# Patient Record
Sex: Female | Born: 1995
Health system: Southern US, Community
[De-identification: ages and names within clinical notes are randomized; demographics above are authoritative.]

## PROBLEM LIST (undated history)

## (undated) DIAGNOSIS — R51 Headache: Secondary | ICD-10-CM

## (undated) DIAGNOSIS — E7800 Pure hypercholesterolemia, unspecified: Secondary | ICD-10-CM

## (undated) DIAGNOSIS — R7303 Prediabetes: Secondary | ICD-10-CM

## (undated) DIAGNOSIS — K915 Postcholecystectomy syndrome: Secondary | ICD-10-CM

## (undated) DIAGNOSIS — E782 Mixed hyperlipidemia: Secondary | ICD-10-CM

## (undated) DIAGNOSIS — R519 Headache, unspecified: Secondary | ICD-10-CM

## (undated) DIAGNOSIS — E66812 Obesity, class 2: Secondary | ICD-10-CM

## (undated) DIAGNOSIS — G4710 Hypersomnia, unspecified: Secondary | ICD-10-CM

## (undated) DIAGNOSIS — F3281 Premenstrual dysphoric disorder: Secondary | ICD-10-CM

## (undated) DIAGNOSIS — G43909 Migraine, unspecified, not intractable, without status migrainosus: Secondary | ICD-10-CM

## (undated) DIAGNOSIS — M109 Gout, unspecified: Secondary | ICD-10-CM

## (undated) DIAGNOSIS — E039 Hypothyroidism, unspecified: Secondary | ICD-10-CM

## (undated) DIAGNOSIS — F411 Generalized anxiety disorder: Secondary | ICD-10-CM

## (undated) DIAGNOSIS — K219 Gastro-esophageal reflux disease without esophagitis: Secondary | ICD-10-CM

## (undated) DIAGNOSIS — E669 Obesity, unspecified: Secondary | ICD-10-CM

## (undated) HISTORY — DX: Postcholecystectomy syndrome: K91.5

## (undated) HISTORY — DX: Mixed hyperlipidemia: E78.2

## (undated) HISTORY — DX: Premenstrual dysphoric disorder: F32.81

## (undated) HISTORY — DX: Migraine, unspecified, not intractable, without status migrainosus: G43.909

## (undated) HISTORY — DX: Headache, unspecified: R51.9

## (undated) HISTORY — DX: Obesity, unspecified: E66.9

## (undated) HISTORY — DX: Gastro-esophageal reflux disease without esophagitis: K21.9

## (undated) HISTORY — DX: Prediabetes: R73.03

## (undated) HISTORY — DX: Generalized anxiety disorder: F41.1

## (undated) HISTORY — DX: Hypothyroidism, unspecified: E03.9

## (undated) HISTORY — DX: Obesity, class 2: E66.812

## (undated) HISTORY — DX: Gout, unspecified: M10.9

## (undated) HISTORY — PX: TONSILLECTOMY: SUR1361

## (undated) HISTORY — DX: Hypersomnia, unspecified: G47.10

## (undated) HISTORY — DX: Headache: R51

---
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
## 2015-12-01 ENCOUNTER — Ambulatory Visit (INDEPENDENT_AMBULATORY_CARE_PROVIDER_SITE_OTHER): Payer: 59 | Admitting: Pediatrics

## 2015-12-01 ENCOUNTER — Encounter: Payer: Self-pay | Admitting: Pediatrics

## 2015-12-01 VITALS — BP 132/86 | HR 79 | Temp 97.8°F | Ht 64.0 in | Wt 177.0 lb

## 2015-12-01 DIAGNOSIS — W57XXXA Bitten or stung by nonvenomous insect and other nonvenomous arthropods, initial encounter: Secondary | ICD-10-CM

## 2015-12-01 DIAGNOSIS — S70922A Unspecified superficial injury of left thigh, initial encounter: Secondary | ICD-10-CM | POA: Diagnosis not present

## 2015-12-01 NOTE — Progress Notes (Signed)
  Subjective:   Patient ID: Brittany Cooper, female    DOB: December 10, 1995, 20 y.o.   MRN: [id number] CC: tick bite left leg  HPI: Brittany Cooper is a 20 y.o. female presenting for tick bite left leg  Ticks behind her house, says was covered with ticks 2 weeks ago Showered right away Didn't notice any attached Noticed bug bite several cm across two days ago White in the center Was very itchy No tick seen Has had headaches for the past two weeks Recently cut all caffeine out from diet Has had caffeine withdrawal headaches in past Drank several reg sodas a day before abruptly stopping hasnt taken anything for the headaches No fevers No other rash No joint aches or pains Normal appetite Otherwise feels fine  Relevant past medical, surgical, family and social history reviewed. Allergies and medications reviewed and updated. History  Smoking Status  . Never Smoker  Smokeless Tobacco  . Never Used   ROS: Per HPI   Objective:    BP 132/86   Pulse 79   Temp 97.8 F (36.6 C) (Oral)   Ht 5\' 4"  (1.626 m)   Wt 177 lb (80.3 kg)   LMP 11/16/2015 (Approximate)   BMI 30.38 kg/m   Wt Readings from Last 3 Encounters:  12/01/15 177 lb (80.3 kg)    Gen: NAD, alert, cooperative with exam, NCAT EYES: EOMI, no conjunctival injection, or no icterus CV: NRRR, normal S1/S2, no murmur Resp: CTABL, no wheezes, normal WOB Ext: No edema, warm Neuro: Alert and oriented, strength equal b/l UE and LE, coordination grossly normal MSK: normal muscle bulk Skin: 3mm slightly raised, slightly red papule lateral L upper thigh. No surrounding redness  Assessment & Plan:  Brittany Cooper was seen today for tick bite left leg.  Diagnoses and all orders for this visit:  Bug bite OTC hydrocortisone, benadryl if needed Let me know if starts having fevers, body aches  Headache Likely due to caffeine withdrawal Let me know if not improving OK to try NSAIDs a couple times a week for HA  Follow up  plan: No Follow-up on file. Rex Krasarol Tuere Nwosu, MD [facility]

## 2016-01-07 ENCOUNTER — Telehealth: Payer: Self-pay | Admitting: Pediatrics

## 2016-01-08 NOTE — Telephone Encounter (Signed)
Shot record ready for pickup 

## 2016-04-04 ENCOUNTER — Telehealth: Payer: 59 | Admitting: Family

## 2016-04-04 DIAGNOSIS — R6889 Other general symptoms and signs: Secondary | ICD-10-CM

## 2016-04-04 MED ORDER — OSELTAMIVIR PHOSPHATE 75 MG PO CAPS: 75.0000 mg | ORAL_CAPSULE | Freq: Two times a day (BID) | ORAL | 0 refills | Status: DC

## 2016-04-04 NOTE — Progress Notes (Signed)

## 2016-11-01 DIAGNOSIS — M109 Gout, unspecified: Secondary | ICD-10-CM

## 2016-11-01 DIAGNOSIS — F3281 Premenstrual dysphoric disorder: Secondary | ICD-10-CM

## 2016-11-01 HISTORY — DX: Premenstrual dysphoric disorder: F32.81

## 2016-11-01 HISTORY — DX: Gout, unspecified: M10.9

## 2016-11-23 ENCOUNTER — Encounter: Payer: Self-pay | Admitting: Family Medicine

## 2016-11-23 ENCOUNTER — Ambulatory Visit (INDEPENDENT_AMBULATORY_CARE_PROVIDER_SITE_OTHER): Payer: BLUE CROSS/BLUE SHIELD | Admitting: Family Medicine

## 2016-11-23 VITALS — BP 120/76 | HR 70 | Temp 97.9°F | Resp 16 | Ht 63.0 in | Wt 158.0 lb

## 2016-11-23 DIAGNOSIS — M109 Gout, unspecified: Secondary | ICD-10-CM

## 2016-11-23 DIAGNOSIS — N920 Excessive and frequent menstruation with regular cycle: Secondary | ICD-10-CM | POA: Diagnosis not present

## 2016-11-23 DIAGNOSIS — M659 Synovitis and tenosynovitis, unspecified: Secondary | ICD-10-CM | POA: Diagnosis not present

## 2016-11-23 DIAGNOSIS — F3281 Premenstrual dysphoric disorder: Secondary | ICD-10-CM

## 2016-11-23 LAB — CBC
HEMATOCRIT: 44.4 % (ref 36.0–46.0)
HEMOGLOBIN: 14.8 g/dL (ref 12.0–15.0)
MCHC: 33.2 g/dL (ref 30.0–36.0)
MCV: 93.6 fl (ref 78.0–100.0)
PLATELETS: 286 10*3/uL (ref 150.0–400.0)
RBC: 4.75 Mil/uL (ref 3.87–5.11)
RDW: 13.3 % (ref 11.5–15.5)
WBC: 5.2 10*3/uL (ref 4.0–10.5)

## 2016-11-23 LAB — POCT URINE PREGNANCY: Preg Test, Ur: NEGATIVE

## 2016-11-23 MED ORDER — DROSPIRENONE-ETHINYL ESTRADIOL 3-0.02 MG PO TABS: ORAL_TABLET | ORAL | 4 refills | Status: DC

## 2016-11-23 MED ORDER — PREDNISONE 20 MG PO TABS: ORAL_TABLET | ORAL | 0 refills | Status: DC

## 2016-11-23 NOTE — Progress Notes (Signed)
Office Note 11/23/2016  CC:  Chief Complaint  Patient presents with  . Establish Care  . Foot Pain  . Menstrual Problem    PMDD?   HPI:  Brittany Cooper is a 21 y.o. female who is here to establish care Patient's most recent primary MD: [facility]--" a long time ago".  She does not have a GYN. Old records were not reviewed prior to or during today's visit.  Past 2.5 weeks has felt a pain from L MTP region extending up anteromedial foot, only hurts with dorsal flexion and extreme of plantar flexion of foot.  Tennis shoes just touching it hurts it.  No trauma or overuse preceding this pain.  Noted not erythema or swelling in the area.  She notes she has been eating a lot more red meat lately with her keto diet. No hx of similar pain, no hx of gout.  Tried ibup--no help.  Her menses are occurring regularly.  Lasts 5d, beginning 1-2 d heavy, then lightens up next 3d. No intermenstrual bleeding. About 1 week before her menses begin, she gets very emotional/angry/irritable--"so bad I can't stand it". This has been the way her menses/psych state has been going for at least 6 mo now. Says it ONLY happens before her menses.   She is not on ocp but would like to be.  She is sexually active. LMP--currently on menses.    FH: mom had PCOS.  Past Medical History:  Diagnosis Date  . Frequent headaches   . Migraines     Past Surgical History:  Procedure Laterality Date  . TONSILLECTOMY     child    Family History  Problem Relation Age of Onset  . Hyperlipidemia Father   . Heart disease Father   . Hypertension Father   . Diabetes Father   . Heart attack Father 75  . Arthritis Maternal Grandmother   . Breast cancer Maternal Grandmother   . Hyperlipidemia Maternal Grandmother   . Heart disease Maternal Grandmother   . Hypertension Maternal Grandmother   . Diabetes Maternal Grandmother   . Multiple myeloma Maternal Grandfather   . Arthritis Paternal Grandmother   . Liver disease  Paternal Grandfather   . Alcohol abuse Paternal Grandfather     Social History   Social History  . Marital status: Single    Spouse name: N/A  . Number of children: N/A  . Years of education: N/A   Occupational History  . Not on file.   Social History Main Topics  . Smoking status: Never Smoker  . Smokeless tobacco: Never Used  . Alcohol use No  . Drug use: No  . Sexual activity: No   Other Topics Concern  . Not on file   Social History Narrative   Single, no children.   Educ: BA at [employer]---wants to go to [employer] as of 11/2016.   Occup: Therapist, occupational at [employer].   No tob.   Rare alc.   No drugs.    Outpatient Encounter Prescriptions as of 11/23/2016  Medication Sig  . drospirenone-ethinyl estradiol (YAZ,GIANVI,LORYNA) 3-0.02 MG tablet 1 tab po qd, start next pack after only a 4 day pill-free interval  . predniSONE (DELTASONE) 20 MG tablet 2 tabs po qd x 5d  . [DISCONTINUED] oseltamivir (TAMIFLU) 75 MG capsule Take 1 capsule (75 mg total) by mouth 2 (two) times daily. (Patient not taking: Reported on 11/23/2016)  . [DISCONTINUED] terbinafine (LAMISIL) 250 MG tablet Take 250 mg by mouth  daily.   No facility-administered encounter medications on file as of 11/23/2016.     No Known Allergies  ROS Review of Systems  Constitutional: Negative for fatigue and fever.  HENT: Negative for congestion and sore throat.   Eyes: Negative for visual disturbance.  Respiratory: Negative for cough.   Cardiovascular: Negative for chest pain.  Gastrointestinal: Negative for abdominal pain and nausea.  Genitourinary: Negative for dysuria.  Musculoskeletal: Positive for arthralgias (see hpi). Negative for back pain and joint swelling.  Skin: Negative for rash.  Neurological: Negative for weakness and headaches.  Hematological: Negative for adenopathy.    PE; Blood pressure 120/76, pulse 70, temperature 97.9 F (36.6 C), temperature source Oral, resp. rate  16, height _0  (1.6 m), weight 158 lb (71.7 kg), last menstrual period 11/20/2016, SpO2 99 %. Body mass index is 27.99 kg/m.  Gen: Alert, well appearing.  Patient is oriented to person, place, time, and situation. AFFECT: pleasant, lucid thought and speech. IDU:PBDH: no injection, icteris, swelling, or exudate.  EOMI, PERRLA. Mouth: lips without lesion/swelling.  Oral mucosa pink and moist. Oropharynx without erythema, exudate, or swelling.  CV: RRR, no m/r/g.   LUNGS: CTA bilat, nonlabored resps, good aeration in all lung fields. EXT: no clubbing, cyanosis, or edema.  Left foot: no erythema, swelling, or deformity.  She has TTP most significantly over L MTP joint and just proximal to this joint--dorsal, lateral, and plantar surface.  Dorsal surface also with signif TTP over extensor tendon all the way up medial aspect of foot, ankle (medial to lateral malleolus), up to mid tibial level medial to tibia.  No erythema or warmth of the areas of tenderness.  ROM of ankles and toes intact but L great toe stiff.  Pain in L ankle with resisted ankle flexion and extension. Remaining musculoskeletal: no joint swelling, erythema, warmth, or tenderness.  ROM of all joints intact. Neuro: CN 2-12 intact bilaterally, strength 5/5 in proximal and distal upper extremities and lower extremities bilaterally.    No tremor.  No ataxia.     Pertinent labs:   POC UPT today was NEG  ASSESSMENT AND PLAN:   New pt; no old records to obtain.  1) Gouty arthritis of L MTP joint, with accompanying extensor tendon tenosynovitis. Prednisone 40 mg qd x 5d. Relative rest at L ankle joint (minimize flexion/extension). Stop her current high protein diet. If recurrent problem, then will check uric acid level, consider gout prophylaxis med.  2) Premenstrual dysphoric disorder: will start Yaz.  She wants the [facility] for contraception purposes as well. She'll start this the day her current menses finishes. She will do a 4 day  pill-free interval instead of the usual 7 day pill-free interval. Check CBC today given history of heavy cycles.  An After Visit Summary was printed and given to the patient.  Return in about 3 months (around 02/23/2017) for f/u PMDD.  Signed:  Crissie Sickles, MD           11/23/2016

## 2016-11-24 ENCOUNTER — Encounter: Payer: Self-pay | Admitting: *Deleted

## 2017-02-20 ENCOUNTER — Other Ambulatory Visit: Payer: Self-pay

## 2017-02-20 ENCOUNTER — Ambulatory Visit (INDEPENDENT_AMBULATORY_CARE_PROVIDER_SITE_OTHER): Payer: BLUE CROSS/BLUE SHIELD | Admitting: Family Medicine

## 2017-02-20 ENCOUNTER — Encounter: Payer: Self-pay | Admitting: Family Medicine

## 2017-02-20 VITALS — BP 131/79 | HR 81 | Temp 98.1°F | Resp 16 | Ht 63.0 in | Wt 160.5 lb

## 2017-02-20 DIAGNOSIS — F411 Generalized anxiety disorder: Secondary | ICD-10-CM | POA: Diagnosis not present

## 2017-02-20 DIAGNOSIS — F3281 Premenstrual dysphoric disorder: Secondary | ICD-10-CM

## 2017-02-20 MED ORDER — DROSPIRENONE-ETHINYL ESTRADIOL 3-0.02 MG PO TABS: ORAL_TABLET | ORAL | 4 refills | Status: DC

## 2017-02-20 MED ORDER — CITALOPRAM HYDROBROMIDE 20 MG PO TABS: 20.0000 mg | ORAL_TABLET | Freq: Every day | ORAL | 1 refills | Status: DC

## 2017-02-20 NOTE — Progress Notes (Signed)
OFFICE VISIT  02/20/2017   CC:  Chief Complaint  Patient presents with  . Follow-up    PMDD    HPI:    Patient is a 21 y.o.  female who presents for 3 mo f/u premenstrual dysphoric disorder. Last visit I started her on Yaz with 4 day interval between cycling pills instead of 7. Also treated her for a likely episode of gout at that time with prednisone.  She did not feel any improvement since getting on the Yaz. She now thinks she has more of chronic anxiety/irritability problem, not just something that affects her in premenstrual period. She admits to chronic worry, irritability, "overthinking everything", perseverates on thins that bother her. Denies depressed mood. Changing diet and increasing exercise has not helped. This is all interfering with her job, boyfriend, family relationships. Has all been present "my whole life" but worse the last couple of years.  ROS: no halluc or delus.  Her menses are regular and NOT with heavy bleeding since getting on Yaz.  Past Medical History:  Diagnosis Date  . Frequent headaches   . Gout 11/2016  . Migraines   . PMDD (premenstrual dysphoric disorder) 11/2016    Past Surgical History:  Procedure Laterality Date  . TONSILLECTOMY     child    Outpatient Medications Prior to Visit  Medication Sig Dispense Refill  . drospirenone-ethinyl estradiol (YAZ,GIANVI,LORYNA) 3-0.02 MG tablet 1 tab po qd, start next pack after only a 4 day pill-free interval 3 Package 4  . predniSONE (DELTASONE) 20 MG tablet 2 tabs po qd x 5d (Patient not taking: Reported on 02/20/2017) 10 tablet 0   No facility-administered medications prior to visit.     No Known Allergies  ROS As per HPI  PE: Blood pressure 131/79, pulse 81, temperature 98.1 F (36.7 C), temperature source Oral, resp. rate 16, height 5\' 3"  (1.6 m), weight 160 lb 8 oz (72.8 kg), last menstrual period 02/15/2017, SpO2 99 %. Wt Readings from Last 2 Encounters:  02/20/17 160 lb 8 oz  (72.8 kg)  11/23/16 158 lb (71.7 kg)    Gen: alert, oriented x 4, affect pleasant.  Lucid thinking and conversation noted. HEENT: PERRLA, EOMI.   Neck: no LAD, mass, or thyromegaly. CV: RRR, no m/r/g LUNGS: CTA bilat, nonlabored. NEURO: no tremor or tics noted on observation.  Coordination intact. CN 2-12 grossly intact bilaterally, strength 5/5 in all extremeties.  No ataxia.  LABS:  Lab Results  Component Value Date   WBC 5.2 11/23/2016   HGB 14.8 11/23/2016   HCT 44.4 11/23/2016   MCV 93.6 11/23/2016   PLT 286.0 11/23/2016    IMPRESSION AND PLAN:  1) PMDD: minimally improved, likely b/c this is more of a GAD problem than we initially thought. She does have improvement of her menorrhagia on this med.  Continue Yaz.  2) GAD:  Start citalopram 20mg  qd. Therapeutic expectations and side effect profile of medication discussed today.  Patient's questions answered.  An After Visit Summary was printed and given to the patient.  FOLLOW UP: Return in about 4 weeks (around 03/20/2017) for f/u anxiety.  Signed:  Santiago BumpersPhil Kaleeyah Cuffie, MD           02/20/2017

## 2017-03-19 ENCOUNTER — Ambulatory Visit (INDEPENDENT_AMBULATORY_CARE_PROVIDER_SITE_OTHER): Payer: BLUE CROSS/BLUE SHIELD | Admitting: Family Medicine

## 2017-03-19 ENCOUNTER — Other Ambulatory Visit: Payer: Self-pay

## 2017-03-19 ENCOUNTER — Encounter: Payer: Self-pay | Admitting: Family Medicine

## 2017-03-19 VITALS — BP 119/79 | HR 65 | Temp 98.3°F | Resp 16 | Ht 63.0 in | Wt 159.5 lb

## 2017-03-19 DIAGNOSIS — F411 Generalized anxiety disorder: Secondary | ICD-10-CM | POA: Diagnosis not present

## 2017-03-19 MED ORDER — CITALOPRAM HYDROBROMIDE 20 MG PO TABS: 20.0000 mg | ORAL_TABLET | Freq: Every day | ORAL | 1 refills | Status: DC

## 2017-03-19 NOTE — Progress Notes (Signed)
OFFICE VISIT  03/19/2017   CC:  Chief Complaint  Patient presents with  . Follow-up    Anxeity   HPI:    Patient is a 21 y.o. Caucasian female who presents for 1 mo f/u GAD. Started citalopram 20mg  qd last o/v.  Doing very well.  "I can tell a huge difference".  Less worry, less irritability.  Side effect: yawns a lot. More energetic, sleeping much better.  Family, friends, coworkers can tell a big difference. No depression, no manic/hypomanic sx's.   Past Medical History:  Diagnosis Date  . Frequent headaches   . GAD (generalized anxiety disorder)   . Gout 11/2016  . Migraines   . PMDD (premenstrual dysphoric disorder) 11/2016    Past Surgical History:  Procedure Laterality Date  . TONSILLECTOMY     child    Outpatient Medications Prior to Visit  Medication Sig Dispense Refill  . drospirenone-ethinyl estradiol (YAZ,GIANVI,LORYNA) 3-0.02 MG tablet 1 tab po qd, start next pack after only a 4 day pill-free interval 3 Package 4  . citalopram (CELEXA) 20 MG tablet Take 1 tablet (20 mg total) daily by mouth. 30 tablet 1   No facility-administered medications prior to visit.     No Known Allergies  ROS As per HPI  PE: Blood pressure 119/79, pulse 65, temperature 98.3 F (36.8 C), temperature source Oral, resp. rate 16, height 5\' 3"  (1.6 m), weight 159 lb 8 oz (72.3 kg), last menstrual period 03/16/2017, SpO2 99 %. Wt Readings from Last 2 Encounters:  03/19/17 159 lb 8 oz (72.3 kg)  02/20/17 160 lb 8 oz (72.8 kg)    Gen: alert, oriented x 4, affect pleasant.  Lucid thinking and conversation noted. HEENT: PERRLA, EOMI.   Neck: no LAD, mass, or thyromegaly. CV: RRR, no m/r/g LUNGS: CTA bilat, nonlabored. NEURO: no tremor or tics noted on observation.  Coordination intact. CN 2-12 grossly intact bilaterally, strength 5/5 in all extremeties.  No ataxia.   LABS:  none  IMPRESSION AND PLAN:  GAD: GREAT response to citalopram 20mg  x 1 mo. In  remission. Discussed plan of remaining on this med for 1 yr and if she wants to try weening off at that time then we'll do that. RF's x 6 mo sent in today.  An After Visit Summary was printed and given to the patient.  FOLLOW UP: Return in about 6 months (around 09/17/2017) for annual CPE (fasting).  Signed:  Santiago BumpersPhil Thera Basden, MD           03/19/2017

## 2017-04-03 DIAGNOSIS — G4710 Hypersomnia, unspecified: Secondary | ICD-10-CM

## 2017-04-03 HISTORY — DX: Hypersomnia, unspecified: G47.10

## 2017-08-31 ENCOUNTER — Other Ambulatory Visit: Payer: Self-pay

## 2017-08-31 MED ORDER — CITALOPRAM HYDROBROMIDE 20 MG PO TABS: 20.0000 mg | ORAL_TABLET | Freq: Every day | ORAL | 0 refills | Status: DC

## 2017-09-19 ENCOUNTER — Encounter: Payer: BLUE CROSS/BLUE SHIELD | Admitting: Family Medicine

## 2017-11-11 ENCOUNTER — Other Ambulatory Visit: Payer: Self-pay | Admitting: Family Medicine

## 2017-11-12 MED ORDER — CITALOPRAM HYDROBROMIDE 20 MG PO TABS: 20.0000 mg | ORAL_TABLET | Freq: Every day | ORAL | 0 refills | Status: DC

## 2017-11-22 ENCOUNTER — Encounter: Payer: BLUE CROSS/BLUE SHIELD | Admitting: Family Medicine

## 2017-11-28 ENCOUNTER — Encounter: Payer: Self-pay | Admitting: Family Medicine

## 2017-11-28 ENCOUNTER — Telehealth: Payer: Self-pay | Admitting: *Deleted

## 2017-11-28 ENCOUNTER — Ambulatory Visit (INDEPENDENT_AMBULATORY_CARE_PROVIDER_SITE_OTHER): Payer: BLUE CROSS/BLUE SHIELD | Admitting: Family Medicine

## 2017-11-28 VITALS — BP 116/73 | HR 71 | Temp 98.2°F | Resp 16 | Ht 62.5 in | Wt 171.4 lb

## 2017-11-28 DIAGNOSIS — Z131 Encounter for screening for diabetes mellitus: Secondary | ICD-10-CM | POA: Diagnosis not present

## 2017-11-28 DIAGNOSIS — Z0000 Encounter for general adult medical examination without abnormal findings: Secondary | ICD-10-CM | POA: Diagnosis not present

## 2017-11-28 DIAGNOSIS — Z23 Encounter for immunization: Secondary | ICD-10-CM

## 2017-11-28 DIAGNOSIS — Z13220 Encounter for screening for lipoid disorders: Secondary | ICD-10-CM | POA: Diagnosis not present

## 2017-11-28 DIAGNOSIS — E669 Obesity, unspecified: Secondary | ICD-10-CM | POA: Diagnosis not present

## 2017-11-28 DIAGNOSIS — Z124 Encounter for screening for malignant neoplasm of cervix: Secondary | ICD-10-CM

## 2017-11-28 LAB — LIPID PANEL
CHOLESTEROL: 278 mg/dL — AB (ref 0–200)
HDL: 86.3 mg/dL (ref >39.00)
LDL Cholesterol: 174 mg/dL — ABNORMAL HIGH (ref 0–99)
NonHDL: 191.52
Total CHOL/HDL Ratio: 3
Triglycerides: 86 mg/dL (ref 0.0–149.0)
VLDL: 17.2 mg/dL (ref 0.0–40.0)

## 2017-11-28 LAB — BASIC METABOLIC PANEL
BUN: 16 mg/dL (ref 6–23)
CO2: 24 mEq/L (ref 19–32)
Calcium: 9.6 mg/dL (ref 8.4–10.5)
Chloride: 103 mEq/L (ref 96–112)
Creatinine, Ser: 0.86 mg/dL (ref 0.40–1.20)
GFR: 86.35 mL/min (ref >60.00)
Glucose, Bld: 83 mg/dL (ref 70–99)
POTASSIUM: 4 meq/L (ref 3.5–5.1)
Sodium: 137 mEq/L (ref 135–145)

## 2017-11-28 NOTE — Patient Instructions (Signed)

## 2017-11-28 NOTE — Telephone Encounter (Signed)
OK, referral ordered 

## 2017-11-28 NOTE — Telephone Encounter (Signed)
Referral pending, needs Dx.

## 2017-11-28 NOTE — Addendum Note (Signed)
Addended by: Jeoffrey MassedMCGOWEN, Channah Godeaux H on: 11/28/2017 04:46 PM   Modules accepted: Orders

## 2017-11-28 NOTE — Telephone Encounter (Signed)
Copied from CRM [id number]. Topic: Referral - Request >> Nov 28, 2017  3:28 PM Oneal GroutSebastian, Jennifer S wrote: Reason for CRM: Patient was seen today by Dr Milinda CaveMcGowen was told to call back with GYN providers name for referral, [facility]

## 2017-11-28 NOTE — Progress Notes (Addendum)
Office Note 11/28/2017  CC:  Chief Complaint  Patient presents with  . Annual Exam    Pt is fasting.     HPI:  Brittany Cooper is a 22 y.o. female who is here for annual health maintenance exam.  She had continued to have very heavy menses flow and started taking her OCP packs back to back w/out sugar pill use and now has menses about q2 mo and it is much lighter.  Working harder on diet lately after she noted she gained some wt, exercise is minimal.  No acute complaints.   Past Medical History:  Diagnosis Date  . Frequent headaches   . GAD (generalized anxiety disorder)   . Gout 11/2016  . Migraines   . PMDD (premenstrual dysphoric disorder) 11/2016    Past Surgical History:  Procedure Laterality Date  . TONSILLECTOMY     child    Family History  Problem Relation Age of Onset  . Hyperlipidemia Father   . Heart disease Father   . Hypertension Father   . Diabetes Father   . Heart attack Father 58  . Arthritis Maternal Grandmother   . Breast cancer Maternal Grandmother   . Hyperlipidemia Maternal Grandmother   . Heart disease Maternal Grandmother   . Hypertension Maternal Grandmother   . Diabetes Maternal Grandmother   . Multiple myeloma Maternal Grandfather   . Arthritis Paternal Grandmother   . Liver disease Paternal Grandfather   . Alcohol abuse Paternal Grandfather     Social History   Socioeconomic History  . Marital status: Single    Spouse name: Not on file  . Number of children: Not on file  . Years of education: Not on file  . Highest education level: Not on file  Occupational History  . Not on file  Social Needs  . Financial resource strain: Not on file  . Food insecurity:    Worry: Not on file    Inability: Not on file  . Transportation needs:    Medical: Not on file    Non-medical: Not on file  Tobacco Use  . Smoking status: Never Smoker  . Smokeless tobacco: Never Used  Substance and Sexual Activity  . Alcohol use: No  .  Drug use: No  . Sexual activity: Never  Lifestyle  . Physical activity:    Days per week: Not on file    Minutes per session: Not on file  . Stress: Not on file  Relationships  . Social connections:    Talks on phone: Not on file    Gets together: Not on file    Attends religious service: Not on file    Active member of club or organization: Not on file    Attends meetings of clubs or organizations: Not on file    Relationship status: Not on file  . Intimate partner violence:    Fear of current or ex partner: Not on file    Emotionally abused: Not on file    Physically abused: Not on file    Forced sexual activity: Not on file  Other Topics Concern  . Not on file  Social History Narrative   Single, no children.   Educ: BA at [employer]---wants to go to [employer] as of 11/2017.   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   No tob.   Rare alc.   No drugs.    Outpatient Medications Prior to Visit  Medication Sig Dispense  Refill  . citalopram (CELEXA) 20 MG tablet Take 1 tablet (20 mg total) by mouth daily. 30 tablet 0  . drospirenone-ethinyl estradiol (YAZ,GIANVI,LORYNA) 3-0.02 MG tablet 1 tab po qd, start next pack after only a 4 day pill-free interval 3 Package 4   No facility-administered medications prior to visit.     No Known Allergies  ROS Review of Systems  Constitutional: Negative for appetite change, chills, fatigue and fever.  HENT: Negative for congestion, dental problem, ear pain and sore throat.   Eyes: Negative for discharge, redness and visual disturbance.  Respiratory: Negative for cough, chest tightness, shortness of breath and wheezing.   Cardiovascular: Negative for chest pain, palpitations and leg swelling.  Gastrointestinal: Negative for abdominal pain, blood in stool, diarrhea, nausea and vomiting.  Genitourinary: Negative for difficulty urinating, dysuria, flank pain, frequency, hematuria and urgency.   Musculoskeletal: Negative for arthralgias, back pain, joint swelling, myalgias and neck stiffness.  Skin: Negative for pallor and rash.  Neurological: Negative for dizziness, speech difficulty, weakness and headaches.  Hematological: Negative for adenopathy. Does not bruise/bleed easily.  Psychiatric/Behavioral: Negative for confusion and sleep disturbance. The patient is not nervous/anxious.     PE; Blood pressure 116/73, pulse 71, temperature 98.2 F (36.8 C), temperature source Oral, resp. rate 16, height 5' 2.5" (1.588 m), weight 171 lb 6 oz (77.7 kg), last menstrual period 11/19/2017, SpO2 98 %. Body mass index is 30.85 kg/m.  Gen: Alert, well appearing.  Patient is oriented to person, place, time, and situation. AFFECT: pleasant, lucid thought and speech. ENT: Ears: EACs clear, normal epithelium.  TMs with good light reflex and landmarks bilaterally.  Eyes: no injection, icteris, swelling, or exudate.  EOMI, PERRLA. Nose: no drainage or turbinate edema/swelling.  No injection or focal lesion.  Mouth: lips without lesion/swelling.  Oral mucosa pink and moist.  Dentition intact and without obvious caries or gingival swelling.  Oropharynx without erythema, exudate, or swelling.  Neck: supple/nontender.  No LAD, mass, or TM.  Carotid pulses 2+ bilaterally, without bruits. CV: RRR, no m/r/g.   LUNGS: CTA bilat, nonlabored resps, good aeration in all lung fields. ABD: soft, NT, ND, BS normal.  No hepatospenomegaly or mass.  No bruits. EXT: no clubbing, cyanosis, or edema.  Musculoskeletal: no joint swelling, erythema, warmth, or tenderness.  ROM of all joints intact. Skin - no sores or suspicious lesions or rashes or color changes   Pertinent labs:  No results found for: TSH Lab Results  Component Value Date   WBC 5.2 11/23/2016   HGB 14.8 11/23/2016   HCT 44.4 11/23/2016   MCV 93.6 11/23/2016   PLT 286.0 11/23/2016    ASSESSMENT AND PLAN:   Health maintenance exam: Reviewed  age and gender appropriate health maintenance issues (prudent diet, regular exercise, health risks of tobacco and excessive alcohol, use of seatbelts, fire alarms in home, use of sunscreen).  Also reviewed age and gender appropriate health screening as well as vaccine recommendations. Vaccines: Tdap--> given today.   Flu--> given today. Labs: BMET (DM screening, obesity) and FLP (hyperchol screening-obesity). Cervical ca screening: refer to GYN MD--> Dr. Stann Mainland, [facility].  An After Visit Summary was printed and given to the patient.  FOLLOW UP:  Return in about 1 year (around 11/29/2018) for annual CPE (fasting).  Signed:  Crissie Sickles, MD           11/28/2017

## 2017-11-28 NOTE — Addendum Note (Signed)
Addended by: Smitty KnudsenSUTHERLAND, Ashlen Kiger K on: 11/28/2017 09:51 AM   Modules accepted: Orders

## 2017-11-29 ENCOUNTER — Encounter: Payer: Self-pay | Admitting: Family Medicine

## 2017-12-05 NOTE — Telephone Encounter (Signed)
Received fax from [facility] stating pt has apt with them on 12/17/17. They are requesting medical records. Fax given to Diane.

## 2017-12-10 ENCOUNTER — Other Ambulatory Visit: Payer: Self-pay | Admitting: Family Medicine

## 2017-12-10 ENCOUNTER — Encounter: Payer: Self-pay | Admitting: Family Medicine

## 2017-12-10 MED ORDER — CITALOPRAM HYDROBROMIDE 20 MG PO TABS: 20.0000 mg | ORAL_TABLET | Freq: Every day | ORAL | 3 refills | Status: DC

## 2017-12-11 ENCOUNTER — Encounter: Payer: Self-pay | Admitting: Family Medicine

## 2017-12-11 NOTE — Telephone Encounter (Signed)
Please advise. Thanks.  

## 2017-12-11 NOTE — Telephone Encounter (Signed)
Pt needs to be seen in office for this situation.-thx

## 2017-12-17 DIAGNOSIS — Z01419 Encounter for gynecological examination (general) (routine) without abnormal findings: Secondary | ICD-10-CM | POA: Diagnosis not present

## 2017-12-17 DIAGNOSIS — Z124 Encounter for screening for malignant neoplasm of cervix: Secondary | ICD-10-CM | POA: Diagnosis not present

## 2017-12-17 DIAGNOSIS — Z6831 Body mass index (BMI) 31.0-31.9, adult: Secondary | ICD-10-CM | POA: Diagnosis not present

## 2017-12-20 LAB — HM PAP SMEAR: HM Pap smear: NEGATIVE

## 2017-12-24 ENCOUNTER — Encounter: Payer: Self-pay | Admitting: Family Medicine

## 2018-01-30 ENCOUNTER — Encounter: Payer: Self-pay | Admitting: Family Medicine

## 2018-01-30 ENCOUNTER — Ambulatory Visit (INDEPENDENT_AMBULATORY_CARE_PROVIDER_SITE_OTHER): Payer: BLUE CROSS/BLUE SHIELD | Admitting: Family Medicine

## 2018-01-30 VITALS — BP 116/73 | HR 73 | Temp 98.4°F | Resp 16 | Ht 62.5 in | Wt 184.5 lb

## 2018-01-30 DIAGNOSIS — L659 Nonscarring hair loss, unspecified: Secondary | ICD-10-CM

## 2018-01-30 DIAGNOSIS — G4719 Other hypersomnia: Secondary | ICD-10-CM | POA: Diagnosis not present

## 2018-01-30 DIAGNOSIS — G4733 Obstructive sleep apnea (adult) (pediatric): Secondary | ICD-10-CM

## 2018-01-30 DIAGNOSIS — R351 Nocturia: Secondary | ICD-10-CM

## 2018-01-30 DIAGNOSIS — R635 Abnormal weight gain: Secondary | ICD-10-CM | POA: Diagnosis not present

## 2018-01-30 DIAGNOSIS — R358 Other polyuria: Secondary | ICD-10-CM | POA: Diagnosis not present

## 2018-01-30 DIAGNOSIS — R946 Abnormal results of thyroid function studies: Secondary | ICD-10-CM | POA: Diagnosis not present

## 2018-01-30 DIAGNOSIS — F411 Generalized anxiety disorder: Secondary | ICD-10-CM

## 2018-01-30 DIAGNOSIS — R3589 Other polyuria: Secondary | ICD-10-CM

## 2018-01-30 LAB — CBC WITH DIFFERENTIAL/PLATELET
Basophils Absolute: 0.1 10*3/uL (ref 0.0–0.1)
Basophils Relative: 0.8 % (ref 0.0–3.0)
Eosinophils Absolute: 0.1 10*3/uL (ref 0.0–0.7)
Eosinophils Relative: 2 % (ref 0.0–5.0)
HCT: 41.1 % (ref 36.0–46.0)
Hemoglobin: 14.1 g/dL (ref 12.0–15.0)
LYMPHS ABS: 2.1 10*3/uL (ref 0.7–4.0)
Lymphocytes Relative: 29.8 % (ref 12.0–46.0)
MCHC: 34.3 g/dL (ref 30.0–36.0)
MCV: 89.2 fl (ref 78.0–100.0)
MONOS PCT: 6.6 % (ref 3.0–12.0)
Monocytes Absolute: 0.5 10*3/uL (ref 0.1–1.0)
NEUTROS ABS: 4.4 10*3/uL (ref 1.4–7.7)
NEUTROS PCT: 60.8 % (ref 43.0–77.0)
Platelets: 277 10*3/uL (ref 150.0–400.0)
RBC: 4.61 Mil/uL (ref 3.87–5.11)
RDW: 12.9 % (ref 11.5–15.5)
WBC: 7.2 10*3/uL (ref 4.0–10.5)

## 2018-01-30 LAB — COMPREHENSIVE METABOLIC PANEL
ALT: 13 U/L (ref 0–35)
AST: 15 U/L (ref 0–37)
Albumin: 4.1 g/dL (ref 3.5–5.2)
Alkaline Phosphatase: 41 U/L (ref 39–117)
BILIRUBIN TOTAL: 0.5 mg/dL (ref 0.2–1.2)
BUN: 11 mg/dL (ref 6–23)
CHLORIDE: 103 meq/L (ref 96–112)
CO2: 24 mEq/L (ref 19–32)
CREATININE: 0.78 mg/dL (ref 0.40–1.20)
Calcium: 9.4 mg/dL (ref 8.4–10.5)
GFR: 96.5 mL/min (ref >60.00)
Glucose, Bld: 80 mg/dL (ref 70–99)
Potassium: 4 mEq/L (ref 3.5–5.1)
Sodium: 137 mEq/L (ref 135–145)
Total Protein: 7.6 g/dL (ref 6.0–8.3)

## 2018-01-30 LAB — POCT URINALYSIS DIPSTICK
BILIRUBIN UA: NEGATIVE
Glucose, UA: NEGATIVE
KETONES UA: NEGATIVE
Leukocytes, UA: NEGATIVE
Nitrite, UA: NEGATIVE
PH UA: 6.5 (ref 5.0–8.0)
Protein, UA: NEGATIVE
RBC UA: NEGATIVE
Spec Grav, UA: 1.02 (ref 1.010–1.025)
UROBILINOGEN UA: 0.2 U/dL

## 2018-01-30 LAB — T4, FREE: Free T4: 0.59 ng/dL — ABNORMAL LOW (ref 0.60–1.60)

## 2018-01-30 LAB — IRON: Iron: 107 ug/dL (ref 42–145)

## 2018-01-30 LAB — TSH: TSH: 7.22 u[IU]/mL — ABNORMAL HIGH (ref 0.35–4.50)

## 2018-01-30 NOTE — Progress Notes (Signed)
OFFICE VISIT  01/30/2018   CC:  Chief Complaint  Patient presents with  . Thyroid Problem    ? Fatigue, dizziness, nausea, hair loss  . Weight Gain    due to medication?   HPI:    Patient is a 22 y.o. Caucasian female who presents for discussion of GAD and her medication: citalopram, which she has been on for 1 yr now.  She has gained 24 lbs since being on citalopram. Appetite is up, wants to eat all the time. Hair is coming out "in clumps" in shower.  Feels sleepy tired all day long.  +Snores.  No known witnessed apnea. She has noted the excessive daytime sleepiness more as she has gained more wt. No constipation.    Has chronic dry skin, no worse lately. Mood: no depression. She is "moody" and irritable and anxious some days and takes an extra 1/2 citalopram. These "bad days" are not nearly as common a good days.  No panic attacks.   +Polyuria and +nocturia, worsening last couple months.  Thirsty: but quenched but sweet tea and sugary drinks. +Nausea recently, w/out vomiting.  Taking dramamine.  No pain.  No fevers, no joint swelling. Her menses: no bleeding since august.  Takes OCP. Urine with stronger smell lately.  No dysuria.  She c/o excessive fatigue, specifically excessive daytime sleepiness.  She snores but has never been told she has any abnormal breathing during sleep. Sleep questionnaire today: score indicated high risk for OSA.  Past Medical History:  Diagnosis Date  . Frequent headaches   . GAD (generalized anxiety disorder)   . Gout 11/2016  . Migraines   . PMDD (premenstrual dysphoric disorder) 11/2016    Past Surgical History:  Procedure Laterality Date  . TONSILLECTOMY     child    Outpatient Medications Prior to Visit  Medication Sig Dispense Refill  . citalopram (CELEXA) 20 MG tablet Take 1 tablet (20 mg total) by mouth daily. 30 tablet 3  . NIKKI 3-0.02 MG tablet TAKE 1 TABLET BY MOUTH EVERY DAY START NEXT PACK AFTER ONLY A 4 DAY PILL-FREE  INTERVAL 84 tablet 1   No facility-administered medications prior to visit.     No Known Allergies  ROS As per HPI  PE: Blood pressure 116/73, pulse 73, temperature 98.4 F (36.9 C), temperature source Oral, resp. rate 16, height 5' 2.5" (1.588 m), weight 184 lb 8 oz (83.7 kg), SpO2 99 %. Gen: Alert, well appearing.  Patient is oriented to person, place, time, and situation. AFFECT: pleasant, lucid thought and speech. Neck: no thyromegaly or thyroid nodule or tenderness. CV: RRR, no m/r/g.   LUNGS: CTA bilat, nonlabored resps, good aeration in all lung fields. EXT: no clubbing or cyanosis.  no edema.  SKIN: no rash.  LABS:    Chemistry      Component Value Date/Time   NA 137 11/28/2017 0942   K 4.0 11/28/2017 0942   CL 103 11/28/2017 0942   CO2 24 11/28/2017 0942   BUN 16 11/28/2017 0942   CREATININE 0.86 11/28/2017 0942      Component Value Date/Time   CALCIUM 9.6 11/28/2017 0942     Lab Results  Component Value Date   WBC 5.2 11/23/2016   HGB 14.8 11/23/2016   HCT 44.4 11/23/2016   MCV 93.6 11/23/2016   PLT 286.0 11/23/2016   CC UA today: completely normal.  IMPRESSION AND PLAN:  1) Excessive wt gain/increased appetite: possibly side effect of her citalopram (although wt gain  on this particular SSRI is unusual). If all lab w/u today is normal, will ween off this med and start trial of fluoxetine (also an SSRI not usually associated with wt gain) Check TSH, T4, T3, CMET, CBC, iron panel, UA (looking for glucosuria).  2) Hair loss, abnl wt gain: will r/o hypothyroidism and iron deficiency.  3) Excessive daytime sleepiness: high risk for OSA. Will order referral to neurologist/sleep MD to further evaluate.  4) GAD: stable.  See #1 above.  An After Visit Summary was printed and given to the patient.  FOLLOW UP: 4 wks  Signed:  Santiago Bumpers, MD           01/30/2018

## 2018-01-31 LAB — IRON AND TIBC
IRON SATURATION: 24 % (ref 15–55)
Iron: 105 ug/dL (ref 27–159)
TIBC: 429 ug/dL (ref 250–450)
UIBC: 324 ug/dL (ref 131–425)

## 2018-02-01 ENCOUNTER — Encounter: Payer: Self-pay | Admitting: Family Medicine

## 2018-02-01 DIAGNOSIS — E038 Other specified hypothyroidism: Secondary | ICD-10-CM

## 2018-02-01 HISTORY — DX: Other specified hypothyroidism: E03.8

## 2018-02-04 LAB — THYROID PEROXIDASE ANTIBODY: Thyroperoxidase Ab SerPl-aCnc: 263 IU/mL — ABNORMAL HIGH (ref <9)

## 2018-02-04 LAB — T3: T3, Total: 182 ng/dL — ABNORMAL HIGH (ref 76–181)

## 2018-02-04 LAB — TEST AUTHORIZATION

## 2018-02-05 ENCOUNTER — Encounter: Payer: Self-pay | Admitting: Neurology

## 2018-02-05 ENCOUNTER — Other Ambulatory Visit: Payer: Self-pay | Admitting: *Deleted

## 2018-02-05 ENCOUNTER — Telehealth: Payer: Self-pay | Admitting: *Deleted

## 2018-02-05 ENCOUNTER — Ambulatory Visit (INDEPENDENT_AMBULATORY_CARE_PROVIDER_SITE_OTHER): Payer: BLUE CROSS/BLUE SHIELD | Admitting: Neurology

## 2018-02-05 VITALS — BP 135/88 | HR 87 | Ht 62.5 in | Wt 184.0 lb

## 2018-02-05 DIAGNOSIS — G4710 Hypersomnia, unspecified: Secondary | ICD-10-CM | POA: Diagnosis not present

## 2018-02-05 DIAGNOSIS — R351 Nocturia: Secondary | ICD-10-CM

## 2018-02-05 DIAGNOSIS — R635 Abnormal weight gain: Secondary | ICD-10-CM

## 2018-02-05 DIAGNOSIS — R0683 Snoring: Secondary | ICD-10-CM

## 2018-02-05 DIAGNOSIS — R51 Headache: Secondary | ICD-10-CM

## 2018-02-05 DIAGNOSIS — E669 Obesity, unspecified: Secondary | ICD-10-CM | POA: Diagnosis not present

## 2018-02-05 DIAGNOSIS — R400 Somnolence: Secondary | ICD-10-CM | POA: Diagnosis not present

## 2018-02-05 DIAGNOSIS — G478 Other sleep disorders: Secondary | ICD-10-CM

## 2018-02-05 DIAGNOSIS — R7989 Other specified abnormal findings of blood chemistry: Secondary | ICD-10-CM

## 2018-02-05 DIAGNOSIS — R6889 Other general symptoms and signs: Secondary | ICD-10-CM

## 2018-02-05 DIAGNOSIS — R519 Headache, unspecified: Secondary | ICD-10-CM

## 2018-02-05 NOTE — Progress Notes (Signed)
Subjective:    Patient ID: Brittany Cooper is a 22 y.o. female.  HPI    Star Age, MD, PhD [facility] [address], [address] [place], [place] [address] Dear Dr. Anitra Lauth,  I saw your patient, Brittany Cooper, upon your kind request, in my clinic today for initial consultation of her sleep disorder, in particular, concern for underlying obstructive sleep apnea. The patient is unaccompanied today. As you know, [name] is a 22 year old right-handed woman with an underlying medical history of migraines, PMDD, history of gout, anxiety, and obesity, who reports snoring and excessive daytime somnolence. She has had recent weight gain. I reviewed your office note from 01/30/2018. Her Epworth sleepiness score is 19 out of 24 today, fatigue score is 60 out of 63. She is single, lives with her significant other (BF), no kids. She has a large dog who sleeps in her bed with her. She has a TV in the bedroom but does not watch it at night.she has been sleepy for a few years, probably since teenage years. She was not sleepy as a child but endorses becoming more sleepy as she grew up. She had a tonsillectomy as a child. She has come close to dozing off at the wheel. She is a nonsmoker and drinks alcohol occasionally, about twice a month, caffeine containing beverage, about 20 ounces per day on average. 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 She also works part-time at [employer] and is in school to become a [employer]. She admits that she is quite busy but she also takes time for sleep, she tries to achieve 8 hours of sleep on any given night, she may be in bed as early as 8 PM, typically no later than 10 PM, she has to be up around 5. She is a restless sleeper. She can nap during the day but tosses and turns. At night, she has very vivid dreams, sometimes scary. She has had sleep paralysis infrequently. She denies hypnagogic or hypnopompic hallucinations or  cataplexy. She has no known family history of narcolepsy but her dad has been quite sleepy. She has had occasional morning headaches and has nocturia about 3 times per average night. She had blood work on 01/30/2018 which showed elevated TSH and increase in thyroid peroxidase antibodies. She is supposed to call back for results. Her sleepiness has become worse in the past year since the weight gain. She has been on Celexa for the past year for anxiety. Her Past Medical History Is Significant For: Past Medical History:  Diagnosis Date  . Frequent headaches   . GAD (generalized anxiety disorder)   . Gout 11/2016  . Migraines   . PMDD (premenstrual dysphoric disorder) 11/2016    Her Past Surgical History Is Significant For: Past Surgical History:  Procedure Laterality Date  . TONSILLECTOMY     child    Her Family History Is Significant For: Family History  Problem Relation Age of Onset  . Hyperlipidemia Father   . Heart disease Father   . Hypertension Father   . Diabetes Father   . Heart attack Father 71  . Arthritis Maternal Grandmother   . Breast cancer Maternal Grandmother   . Hyperlipidemia Maternal Grandmother   . Heart disease Maternal Grandmother   . Hypertension Maternal Grandmother   . Diabetes Maternal Grandmother   . Multiple myeloma Maternal Grandfather   . Arthritis Paternal Grandmother   . Liver disease Paternal Grandfather   .  Alcohol abuse Paternal Grandfather     Her Social History Is Significant For: Social History   Socioeconomic History  . Marital status: Single    Spouse name: Not on file  . Number of children: Not on file  . Years of education: Not on file  . Highest education level: Not on file  Occupational History  . Not on file  Social Needs  . Financial resource strain: Not on file  . Food insecurity:    Worry: Not on file    Inability: Not on file  . Transportation needs:    Medical: Not on file    Non-medical: Not on file  Tobacco Use   . Smoking status: Never Smoker  . Smokeless tobacco: Never Used  Substance and Sexual Activity  . Alcohol use: No  . Drug use: No  . Sexual activity: Never  Lifestyle  . Physical activity:    Days per week: Not on file    Minutes per session: Not on file  . Stress: Not on file  Relationships  . Social connections:    Talks on phone: Not on file    Gets together: Not on file    Attends religious service: Not on file    Active member of club or organization: Not on file    Attends meetings of clubs or organizations: Not on file    Relationship status: Not on file  Other Topics Concern  . Not on file  Social History Narrative   Single, no children.   Educ: BA at [employer]---wants to go to [employer] as of 11/2017.   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   No tob.   Rare alc.   No drugs.    Her Allergies Are:  No Known Allergies:   Her Current Medications Are:  Outpatient Encounter Medications as of 02/05/2018  Medication Sig  . citalopram (CELEXA) 20 MG tablet Take 1 tablet (20 mg total) by mouth daily.  Marland Kitchen NIKKI 3-0.02 MG tablet TAKE 1 TABLET BY MOUTH EVERY DAY START NEXT PACK AFTER ONLY A 4 DAY PILL-FREE INTERVAL   No facility-administered encounter medications on file as of 02/05/2018.   :  Review of Systems:  Out of a complete 14 point review of systems, all are reviewed and negative with the exception of these symptoms as listed below: Review of Systems  Neurological:       Pt presents today to discuss her sleep. Pt has never had a sleep study but does endorse snoring.  Epworth Sleepiness Scale 0= would never doze 1= slight chance of dozing 2= moderate chance of dozing 3= high chance of dozing  Sitting and reading: 3 Watching TV: 3 Sitting inactive in a public place (ex. Theater or meeting): 2 As a passenger in a car for an hour without a break: 3 Lying down to rest in the afternoon: 3 Sitting and talking to  someone: 1 Sitting quietly after lunch (no alcohol): 3 In a car, while stopped in traffic: 1 Total: 19     Objective:  Neurological Exam  Physical Exam Physical Examination:   Vitals:   02/05/18 1358  BP: 135/88  Pulse: 87    General Examination: The patient is a very pleasant 22 y.o. female in no acute distress. She appears well-developed and well-nourished and well groomed.   HEENT: Normocephalic, atraumatic, pupils are equal, round and reactive to light and accommodation. Extraocular tracking is good without limitation to gaze  excursion or nystagmus noted. Normal smooth pursuit is noted. Hearing is grossly intact. Face is symmetric with normal facial animation and normal facial sensation. Speech is clear with no dysarthria noted. There is no hypophonia. There is no lip, neck/head, jaw or voice tremor. Neck is supple with full range of passive and active motion. There are no carotid bruits on auscultation. Oropharynx exam reveals: mild mouth dryness, good dental hygiene andno significant airway crowding. She has a small uvula, tonsils absent, Mallampati class I. Neck circumference is 14-1/2 inches. Tongue protrudes centrally and palate elevates symmetrically.  Chest: Clear to auscultation without wheezing, rhonchi or crackles noted.  Heart: S1+S2+0, regular and normal without murmurs, rubs or gallops noted.   Abdomen: Soft, non-tender and non-distended with normal bowel sounds appreciated on auscultation.  Extremities: There is no pitting edema in the distal lower extremities bilaterally.  Skin: Warm and dry without trophic changes noted.  Musculoskeletal: exam reveals no obvious joint deformities, tenderness or joint swelling or erythema.   Neurologically:  Mental status: The patient is awake, alert and oriented in all 4 spheres. Her immediate and remote memory, attention, language skills and fund of knowledge are appropriate. There is no evidence of aphasia, agnosia, apraxia  or anomia. Speech is clear with normal prosody and enunciation. Thought process is linear. Mood is normal and affect is normal.  Cranial nerves II - XII are as described above under HEENT exam. In addition: shoulder shrug is normal with equal shoulder height noted. Motor exam: Normal bulk, strength and tone is noted. There is no drift, tremor or rebound. Romberg is negative. Reflexes are 2+ throughout. Fine motor skills and coordination: intact with normal finger taps, normal hand movements, normal rapid alternating patting, normal foot taps and normal foot agility.  Cerebellar testing: No dysmetria or intention tremor. There is no truncal or gait ataxia.  Sensory exam: intact to light touch in the upper and lower extremities.  Gait, station and balance: She stands easily. No veering to one side is noted. No leaning to one side is noted. Posture is age-appropriate and stance is narrow based. Gait shows normal stride length and normal pace. No problems turning are noted. Tandem walk is unremarkable.   Assessment and Plan:  In summary, DECLYN OFFIELD is a very pleasant 22 y.o.-year old female with an underlying medical history of migraines, PMDD, history of gout, anxiety, and obesity, who presents for evaluation of her sleep disorder, in particular her significant daytime somnolence. Her history and examination are supportive of possible underlying obstructive sleep apnea, she also gives a family history of sleep apnea affecting her maternal uncle who was a CPAP machine and maternal grandmother also had sleep apnea. However, she may also have an underlying hypersomnolence disorder including possible idiopathic hypersomnolence, versus narcolepsy without cataplexy. She has recently had some abnormal blood test results including evidence of hypothyroidism and is supposed to call your office back for discussion. I would like to proceed with sleep study testing in the form of nocturnal polysomnogram, followed by  a next a nap study to investigate her daytime somnolence. She is advised not to drive when feeling sleepy. She is furthermore advised that in preparation for sleep study testing she would have to taper her Celexa. She's not on a high-dose but is advised to discuss with you safe taper of this medication. She would be willing to pursue this. Should she have underlying obstructive sleep disordered breathing I would recommend that we proceed with sleep apnea treatment. She would  be willing to try CPAP if necessary. I will see her back after sleep study testing is completed. I answered all her questions today and she was in agreement. Thank you very much for allowing me to participate in the care of this nice patient. If I can be of any further assistance to you please do not hesitate to call me at [phone].  Sincerely,   Star Age, MD, PhD

## 2018-02-05 NOTE — Telephone Encounter (Signed)
Pt called stating that she had her sleep consult apt today and the provider advised her she will need to ask her PCP how to wean of the Celexa, she will need to be off this med for 3 weeks before they can do any testing.   Please advise. Thanks.

## 2018-02-05 NOTE — Patient Instructions (Addendum)
I believe you may have an underlying sleepiness condition. This means, that you may have a sleep disorder that manifests with excessive sleep and excessive sleepiness during the day, such as narcolepsy or idiopathic hypersomnolence.   We will do sleep testing at this point: I would like for you to come back for an overnight sleep study during which we will monitor your night time sleep and we will do nap study testing the next day: 5 scheduled 20 min nap opportunities, every 2 hours. We will remind you to stay awake in between naps.    If your night time sleep study shows sleep apnea, we will not do the nap study and would treat your sleep apnea.   As explained, you will have to be off of any excessive caffeine or stimulant or antidepressant medication in preparation for the sleep studies. Talk to Dr. Milinda Cave about tapering the Celexa; remember, you will have to stay off of it for at least 3 weeks prior to sleep testing.

## 2018-02-07 ENCOUNTER — Other Ambulatory Visit (INDEPENDENT_AMBULATORY_CARE_PROVIDER_SITE_OTHER): Payer: BLUE CROSS/BLUE SHIELD

## 2018-02-07 DIAGNOSIS — R7989 Other specified abnormal findings of blood chemistry: Secondary | ICD-10-CM

## 2018-02-07 LAB — T4, FREE: FREE T4: 0.68 ng/dL (ref 0.60–1.60)

## 2018-02-07 LAB — TSH: TSH: 7.77 u[IU]/mL — ABNORMAL HIGH (ref 0.35–4.50)

## 2018-02-07 NOTE — Telephone Encounter (Signed)
She can take 1/2 of her 20mg  celexa tab per day for 1 week, then 1/2 of tab every other day for 5 doses, then stop completely.

## 2018-02-08 ENCOUNTER — Encounter: Payer: Self-pay | Admitting: Family Medicine

## 2018-02-08 LAB — T3: T3, Total: 162 ng/dL (ref 76–181)

## 2018-02-08 NOTE — Telephone Encounter (Signed)
Left detailed message of instructions to stop celexa.  Okay per DPR.  Okay for PEC to disclose information if patient returns call with questions.

## 2018-02-14 ENCOUNTER — Encounter: Payer: Self-pay | Admitting: Family Medicine

## 2018-02-14 ENCOUNTER — Other Ambulatory Visit: Payer: BLUE CROSS/BLUE SHIELD

## 2018-02-18 ENCOUNTER — Encounter: Payer: Self-pay | Admitting: Family Medicine

## 2018-02-25 ENCOUNTER — Ambulatory Visit: Payer: BLUE CROSS/BLUE SHIELD | Admitting: Family Medicine

## 2018-03-04 ENCOUNTER — Other Ambulatory Visit: Payer: Self-pay | Admitting: Family Medicine

## 2018-03-18 ENCOUNTER — Encounter: Payer: Self-pay | Admitting: Neurology

## 2018-03-20 ENCOUNTER — Encounter: Payer: Self-pay | Admitting: Family Medicine

## 2018-03-20 NOTE — Telephone Encounter (Signed)
Please advise. Thanks.  

## 2018-03-28 ENCOUNTER — Encounter: Payer: Self-pay | Admitting: Family Medicine

## 2018-03-28 ENCOUNTER — Ambulatory Visit (INDEPENDENT_AMBULATORY_CARE_PROVIDER_SITE_OTHER): Payer: BLUE CROSS/BLUE SHIELD | Admitting: Family Medicine

## 2018-03-28 VITALS — BP 120/80 | HR 80 | Temp 98.7°F | Resp 16 | Ht 62.5 in | Wt 194.0 lb

## 2018-03-28 DIAGNOSIS — R110 Nausea: Secondary | ICD-10-CM

## 2018-03-28 DIAGNOSIS — R635 Abnormal weight gain: Secondary | ICD-10-CM | POA: Diagnosis not present

## 2018-03-28 DIAGNOSIS — G4719 Other hypersomnia: Secondary | ICD-10-CM

## 2018-03-28 DIAGNOSIS — F411 Generalized anxiety disorder: Secondary | ICD-10-CM

## 2018-03-28 MED ORDER — SERTRALINE HCL 50 MG PO TABS: 50.0000 mg | ORAL_TABLET | Freq: Every day | ORAL | 0 refills | Status: DC

## 2018-03-28 NOTE — Patient Instructions (Signed)
Take one 50mg  sertraline once a day for 15 days, then take TWO of the 50mg  tabs once a day.

## 2018-03-28 NOTE — Progress Notes (Signed)
OFFICE VISIT  03/28/2018   CC:  Chief Complaint  Patient presents with  . Follow-up    depression and anxiety, wants to discuss changing celexa-sleeping to much   HPI:    Patient is a 22 y.o.  female who presents for f/u GAD. I last saw her about 2 mo ago, at which time we began the process of weening off of citalopram.  She thought maybe this med was making her gain wt.  Her chronic anxiety was pretty well controlled at that time.  She has seen Dr. Frances FurbishAthar in neuro for eval regarding OSA. He had her ween on citalopram prior to getting sleep study.  Her anxiety level is fairly high and we need to get her back on a daily antidepressant. She had a panic attack on the day of the sleep study so this will have to get rescheduled.  Still gaining wt. She plans on seeing her GYN soon, possibly getting on phentermine. Mult preg tests at home neg.  Needs new antidepressant for daily GAD tx but wants to avoid one that may make her sleep too much during the day. Mood is not signif depressed, just frustrated with her wt gain. Says she just doesn't feel right.  Is nauseous every day, no vomiting.  Eating occ makes it better but never worse.  Denies heartburn, burping, reflux etc. She has not had this worked up..  Past Medical History:  Diagnosis Date  . Frequent headaches   . GAD (generalized anxiety disorder)   . Gout 11/2016  . Hypersomnolence 2019   Noct polysomn + multiple sleep latency testing planned as of Nov 2019 neuro eval.  . Migraines   . PMDD (premenstrual dysphoric disorder) 11/2016  . Subclinical hypothyroidism 02/2018   + TPO ab mildly elevated-->evolving hashimoto's.    Past Surgical History:  Procedure Laterality Date  . TONSILLECTOMY     child    Outpatient Medications Prior to Visit  Medication Sig Dispense Refill  . NIKKI 3-0.02 MG tablet TAKE 1 TABLET BY MOUTH EVERY DAY START NEXT PACK AFTER ONLY A 4 DAY PILL-FREE INTERVAL 84 tablet 1  . citalopram (CELEXA) 20  MG tablet Take 1 tablet (20 mg total) by mouth daily. 30 tablet 3   No facility-administered medications prior to visit.     No Known Allergies  ROS As per HPI  PE: Blood pressure 120/80, pulse 80, temperature 98.7 F (37.1 C), temperature source Oral, resp. rate 16, height 5' 2.5" (1.588 m), weight 194 lb (88 kg), SpO2 97 %. Gen: Alert, well appearing.  Patient is oriented to person, place, time, and situation. AFFECT: pleasant, lucid thought and speech. No further exam today.  LABS:   Lab Results  Component Value Date   TSH 7.77 (H) 02/07/2018   T3TOTAL 162 02/07/2018    Lab Results  Component Value Date   WBC 7.2 01/30/2018   HGB 14.1 01/30/2018   HCT 41.1 01/30/2018   MCV 89.2 01/30/2018   PLT 277.0 01/30/2018   Lab Results  Component Value Date   IRON 107 01/30/2018   IRON 105 01/30/2018   TIBC 429 01/30/2018    Lab Results  Component Value Date   CREATININE 0.78 01/30/2018   BUN 11 01/30/2018   NA 137 01/30/2018   K 4.0 01/30/2018   CL 103 01/30/2018   CO2 24 01/30/2018   Lab Results  Component Value Date   ALT 13 01/30/2018   AST 15 01/30/2018   ALKPHOS 41 01/30/2018  BILITOT 0.5 01/30/2018   Lab Results  Component Value Date   CHOL 278 (H) 11/28/2017   Lab Results  Component Value Date   HDL 86.30 11/28/2017   Lab Results  Component Value Date   LDLCALC 174 (H) 11/28/2017   Lab Results  Component Value Date   TRIG 86.0 11/28/2017   Lab Results  Component Value Date   CHOLHDL 3 11/28/2017    IMPRESSION AND PLAN:  1) GAD, hx of excessive sleepiness on citalopram. She has been completely weened off citalopram. Start sertraline 50mg , 1 qd x 2 weeks, then increase to 2 per day. Therapeutic expectations and side effect profile of medication discussed today.  Patient's questions answered.  2) Excessive daytime sleepiness: high risk for OSA. However, now that she is off of her citalopram she doesn't have any excessive daytime  somnolence.  She will still plan on rescheduling her sleep study as per Dr. Teofilo PodAthar's plan.  3) Excessive wt gain: extensive lab eval 91mo ago unremarkable. She may be starting phentermine via her GYN soon---appt in 4-5d.  4) Chronic nausea: she has repeatedly ruled out pregnancy with home UPTs. I wonder about severe/atypical GERD vs chronic gastritis. I will do further questioning/workup for this when I see her for f/u in 3 wks.  An After Visit Summary was printed and given to the patient.  FOLLOW UP: Return in about 3 weeks (around 04/18/2018) for  (30 min appt--anx and nausea).  Signed:  Santiago BumpersPhil Cleatus Goodin, MD           03/28/2018

## 2018-04-02 ENCOUNTER — Ambulatory Visit: Payer: BLUE CROSS/BLUE SHIELD | Admitting: Family Medicine

## 2018-04-17 ENCOUNTER — Other Ambulatory Visit: Payer: Self-pay | Admitting: Family Medicine

## 2018-04-17 ENCOUNTER — Encounter: Payer: Self-pay | Admitting: Family Medicine

## 2018-04-17 MED ORDER — SERTRALINE HCL 100 MG PO TABS: 100.0000 mg | ORAL_TABLET | Freq: Every day | ORAL | 1 refills | Status: DC

## 2018-04-17 NOTE — Telephone Encounter (Signed)
Please advise. Thanks.  

## 2018-04-17 NOTE — Telephone Encounter (Signed)
I'll send in rx for 100mg  sertraline. I'll decline this 50mg  sertraline RF request.

## 2018-04-23 ENCOUNTER — Encounter: Payer: Self-pay | Admitting: Family Medicine

## 2018-04-23 ENCOUNTER — Ambulatory Visit (INDEPENDENT_AMBULATORY_CARE_PROVIDER_SITE_OTHER): Payer: BLUE CROSS/BLUE SHIELD | Admitting: Family Medicine

## 2018-04-23 VITALS — BP 122/81 | HR 84 | Temp 98.2°F | Resp 16 | Ht 62.5 in | Wt 180.4 lb

## 2018-04-23 DIAGNOSIS — R110 Nausea: Secondary | ICD-10-CM

## 2018-04-23 DIAGNOSIS — F411 Generalized anxiety disorder: Secondary | ICD-10-CM | POA: Diagnosis not present

## 2018-04-23 DIAGNOSIS — R7989 Other specified abnormal findings of blood chemistry: Secondary | ICD-10-CM

## 2018-04-23 LAB — T4, FREE: FREE T4: 0.94 ng/dL (ref 0.60–1.60)

## 2018-04-23 LAB — TSH: TSH: 4.01 u[IU]/mL (ref 0.35–4.50)

## 2018-04-23 NOTE — Progress Notes (Signed)
OFFICE VISIT  04/23/2018   CC:  Chief Complaint  Patient presents with  . Follow-up    Anxiety and Nausea     HPI:    Patient is a 23 y.o. Caucasian female who presents for 3 week f/u GAD, started sertraline 50mg  qd and gave instructions for titrating this up to 100 mg qd. Since last visit with me, her GYN started her on phentermine.  She is happy with this. Had some insomnia initially on sertraline but this is waning over time. She feels like she has started to feel improved ragarding anxiety.  No panic attacks.  She was also having morning nausea w/out vomiting.  UPTs repeatedly neg. Question atypical GER vs chronic gastritis.   THIS has been less problematic --essentially gone--and she feels like this was coming from menses/OCPs at the time of last f/u visit.  ROS: no tremors, no vision/eye changes, no signif fatigue, no cold intolerance  Past Medical History:  Diagnosis Date  . Frequent headaches   . GAD (generalized anxiety disorder)   . Gout 11/2016  . Hypersomnolence 2019   Noct polysomn + multiple sleep latency testing planned as of Nov 2019 neuro eval.  . Migraines   . PMDD (premenstrual dysphoric disorder) 11/2016  . Subclinical hypothyroidism 02/2018   + TPO ab mildly elevated-->evolving hashimoto's.    Past Surgical History:  Procedure Laterality Date  . TONSILLECTOMY     child    Outpatient Medications Prior to Visit  Medication Sig Dispense Refill  . NIKKI 3-0.02 MG tablet TAKE 1 TABLET BY MOUTH EVERY DAY START NEXT PACK AFTER ONLY A 4 DAY PILL-FREE INTERVAL 84 tablet 1  . phentermine (ADIPEX-P) 37.5 MG tablet Take 1 tablet by mouth daily.    . sertraline (ZOLOFT) 100 MG tablet Take 1 tablet (100 mg total) by mouth daily. 30 tablet 1   No facility-administered medications prior to visit.     No Known Allergies  ROS As per HPI  PE: Blood pressure 122/81, pulse 84, temperature 98.2 F (36.8 C), temperature source Oral, resp. rate 16, height 5'  2.5" (1.588 m), weight 180 lb 6 oz (81.8 kg), last menstrual period 03/26/2018, SpO2 100 %. Gen: Alert, well appearing.  Patient is oriented to person, place, time, and situation. AFFECT: pleasant, lucid thought and speech. CV: RRR, no m/r/g.   LUNGS: CTA bilat, nonlabored resps, good aeration in all lung fields. No tremors. Neck: no TM or thyroid tenderness.  LABS:   Lab Results  Component Value Date   TSH 7.77 (H) 02/07/2018   T3TOTAL 162 02/07/2018  T4 was 0.68 (wnl) on 02/07/18 TPO Ab was 263 (mildly elevated) on 01/30/18  Lab Results  Component Value Date   WBC 7.2 01/30/2018   HGB 14.1 01/30/2018   HCT 41.1 01/30/2018   MCV 89.2 01/30/2018   PLT 277.0 01/30/2018   Lab Results  Component Value Date   IRON 107 01/30/2018   IRON 105 01/30/2018   TIBC 429 01/30/2018    Lab Results  Component Value Date   CREATININE 0.78 01/30/2018   BUN 11 01/30/2018   NA 137 01/30/2018   K 4.0 01/30/2018   CL 103 01/30/2018   CO2 24 01/30/2018   Lab Results  Component Value Date   ALT 13 01/30/2018   AST 15 01/30/2018   ALKPHOS 41 01/30/2018   BILITOT 0.5 01/30/2018   Lab Results  Component Value Date   CHOL 278 (H) 11/28/2017   Lab Results  Component Value  Date   HDL 86.30 11/28/2017   Lab Results  Component Value Date   LDLCALC 174 (H) 11/28/2017   Lab Results  Component Value Date   TRIG 86.0 11/28/2017   Lab Results  Component Value Date   CHOLHDL 3 11/28/2017    IMPRESSION AND PLAN:  1) GAD: slight improvement already, tolerating sertraline at 100mg  qd dose. Continue this.  I really think a lot of her improvement is tied to finally getting on the phentermine from her GYN-->she is REALLY apprehensive about her wt and wants to lose a lot. No change today.  2) Morning nausea: resolved.  OCP and menses-related.  3) Subclinical hypothyroidism: we'll recheck TSH, T4, and T3 today.  An After Visit Summary was printed and given to the patient.  FOLLOW  UP: Return in about 3 months (around 07/23/2018) for f/u anxiety.  Signed:  Santiago BumpersPhil , MD           04/23/2018

## 2018-04-24 ENCOUNTER — Telehealth: Payer: Self-pay | Admitting: Family Medicine

## 2018-04-24 ENCOUNTER — Encounter: Payer: Self-pay | Admitting: Family Medicine

## 2018-04-24 LAB — T3: T3, Total: 157 ng/dL (ref 76–181)

## 2018-04-24 NOTE — Telephone Encounter (Signed)
Pt given lab results per notes of Dr Milinda Cave on 04/24/2018. Pt verbalized understanding.

## 2018-05-02 ENCOUNTER — Encounter: Payer: Self-pay | Admitting: Family Medicine

## 2018-05-03 MED ORDER — DROSPIRENONE-ETHINYL ESTRADIOL 3-0.02 MG PO TABS: ORAL_TABLET | ORAL | 1 refills | Status: DC

## 2018-05-11 ENCOUNTER — Other Ambulatory Visit: Payer: Self-pay | Admitting: Family Medicine

## 2018-05-14 DIAGNOSIS — N921 Excessive and frequent menstruation with irregular cycle: Secondary | ICD-10-CM | POA: Diagnosis not present

## 2018-05-14 DIAGNOSIS — Z6830 Body mass index (BMI) 30.0-30.9, adult: Secondary | ICD-10-CM | POA: Diagnosis not present

## 2018-05-15 ENCOUNTER — Other Ambulatory Visit: Payer: Self-pay | Admitting: Family Medicine

## 2018-05-28 DIAGNOSIS — N921 Excessive and frequent menstruation with irregular cycle: Secondary | ICD-10-CM | POA: Diagnosis not present

## 2018-05-28 DIAGNOSIS — Z6830 Body mass index (BMI) 30.0-30.9, adult: Secondary | ICD-10-CM | POA: Diagnosis not present

## 2018-07-23 ENCOUNTER — Ambulatory Visit: Payer: BLUE CROSS/BLUE SHIELD | Admitting: Family Medicine

## 2018-11-12 ENCOUNTER — Other Ambulatory Visit: Payer: Self-pay

## 2018-11-12 ENCOUNTER — Encounter: Payer: Self-pay | Admitting: Family Medicine

## 2018-11-12 MED ORDER — SERTRALINE HCL 100 MG PO TABS: 100.0000 mg | ORAL_TABLET | Freq: Every day | ORAL | 0 refills | Status: DC

## 2018-12-05 ENCOUNTER — Other Ambulatory Visit: Payer: Self-pay | Admitting: Family Medicine

## 2018-12-23 ENCOUNTER — Other Ambulatory Visit: Payer: Self-pay | Admitting: Family Medicine

## 2018-12-24 MED ORDER — SERTRALINE HCL 100 MG PO TABS: 100.0000 mg | ORAL_TABLET | Freq: Every day | ORAL | 0 refills | Status: DC

## 2019-01-24 ENCOUNTER — Other Ambulatory Visit: Payer: Self-pay | Admitting: Family Medicine

## 2019-01-28 ENCOUNTER — Other Ambulatory Visit: Payer: Self-pay

## 2019-01-29 ENCOUNTER — Other Ambulatory Visit: Payer: Self-pay

## 2019-01-29 ENCOUNTER — Encounter: Payer: Self-pay | Admitting: Family Medicine

## 2019-01-29 ENCOUNTER — Ambulatory Visit (INDEPENDENT_AMBULATORY_CARE_PROVIDER_SITE_OTHER): Payer: BC Managed Care – PPO | Admitting: Family Medicine

## 2019-01-29 VITALS — Wt 180.0 lb

## 2019-01-29 DIAGNOSIS — F411 Generalized anxiety disorder: Secondary | ICD-10-CM | POA: Diagnosis not present

## 2019-01-29 MED ORDER — SERTRALINE HCL 100 MG PO TABS: 100.0000 mg | ORAL_TABLET | Freq: Every day | ORAL | 3 refills | Status: DC

## 2019-01-29 NOTE — Progress Notes (Signed)
Virtual Visit via Video Note  I connected with pt on 01/29/19 at  4:00 PM EDT by telephone (video enabled telemedicine application was not a platform that the patient could access) and verified that I am speaking with the correct person using two identifiers.  Location patient: home Location provider:work or home office Persons participating in the virtual visit: patient, provider  I discussed the limitations of evaluation and management by telemedicine and the availability of in person appointments. The patient expressed understanding and agreed to proceed.  Telemedicine visit is a necessity given the COVID-19 restrictions in place at the current time.  HPI: 23 y/o WM with whom I am doing a telephone visit today (due to COVID-19 pandemic restrictions) for f/u GAD.  I last saw her 9 months ago. A/P as of last f/u visit was: "GAD: slight improvement already, tolerating sertraline at 14m qd dose. Continue this.  I really think a lot of her improvement is tied to finally getting on the phentermine from her GYN-->she is REALLY apprehensive about her wt and wants to lose a lot. No change today."  Interim hx: Doing well. Went through a divorce July this year and coped with this fine. Sleep is good, appetite good-->diet worse since working at home. Not much exercise.  Working [facility] [employer]    ROS: See pertinent positives and negatives per HPI.  Past Medical History:  Diagnosis Date  . Frequent headaches   . GAD (generalized anxiety disorder)   . Gout 11/2016  . Hypersomnolence 2019   Noct polysomn + multiple sleep latency testing planned as of Nov 2019 neuro eval.  . Migraines   . PMDD (premenstrual dysphoric disorder) 11/2016  . Subclinical hypothyroidism 02/2018   + TPO ab mildly elevated-->evolving hashimoto's.  TSH back to normal 04/23/2018.    Past Surgical History:  Procedure Laterality Date  . TONSILLECTOMY     child    Family History  Problem Relation Age  of Onset  . Hyperlipidemia Father   . Heart disease Father   . Hypertension Father   . Diabetes Father   . Heart attack Father 34 . Arthritis Maternal Grandmother   . Breast cancer Maternal Grandmother   . Hyperlipidemia Maternal Grandmother   . Heart disease Maternal Grandmother   . Hypertension Maternal Grandmother   . Diabetes Maternal Grandmother   . Multiple myeloma Maternal Grandfather   . Arthritis Paternal Grandmother   . Liver disease Paternal Grandfather   . Alcohol abuse Paternal Grandfather     SOCIAL HX:  Social History   Socioeconomic History  . Marital status: Single    Spouse name: Not on file  . Number of children: Not on file  . Years of education: Not on file  . Highest education level: Not on file  Occupational History  . Not on file  Social Needs  . Financial resource strain: Not on file  . Food insecurity    Worry: Not on file    Inability: Not on file  . Transportation needs    Medical: Not on file    Non-medical: Not on file  Tobacco Use  . Smoking status: Never Smoker  . Smokeless tobacco: Never Used  Substance and Sexual Activity  . Alcohol use: No  . Drug use: No  . Sexual activity: Never  Lifestyle  . Physical activity    Days per week: Not on file    Minutes per session: Not on file  . Stress: Not on file  Relationships  . Social Herbalist on phone: Not on file    Gets together: Not on file    Attends religious service: Not on file    Active member of club or organization: Not on file    Attends meetings of clubs or organizations: Not on file    Relationship status: Not on file  Other Topics Concern  . Not on file  Social History Narrative   Single, no children.   Educ: BA at [employer]---wants to go to [employer] as of 11/2017.   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   No tob.   Rare alc.   No drugs.     Current Outpatient Medications:  .  NORTREL 1/35, 21, tablet,  Take 1 tablet by mouth daily., Disp: , Rfl:  .  sertraline (ZOLOFT) 100 MG tablet, Take 1 tablet (100 mg total) by mouth daily., Disp: 30 tablet, Rfl: 0  EXAM:  VITALS per patient if applicable: There were no vitals taken for this visit.   GENERAL: alert, oriented, appears well and in no acute distress  HEENT: atraumatic, conjunttiva clear, no obvious abnormalities on inspection of external nose and ears  NECK: normal movements of the head and neck  LUNGS: on inspection no signs of respiratory distress, breathing rate appears normal, no obvious gross SOB, gasping or wheezing  CV: no obvious cyanosis  MS: moves all visible extremities without noticeable abnormality  PSYCH/NEURO: pleasant and cooperative, no obvious depression or anxiety, speech and thought processing grossly intact  ASSESSMENT AND PLAN:  Discussed the following assessment and plan:  GAD, hx of panic. Doing very well on sertraline 120m qd.  Continue this, call if problems, plan f/u 1 yr.  I discussed the assessment and treatment plan with the patient. The patient was provided an opportunity to ask questions and all were answered. The patient agreed with the plan and demonstrated an understanding of the instructions.   The patient was advised to call back or seek an in-person evaluation if the symptoms worsen or if the condition fails to improve as anticipated.  Spent 10 min with pt today, with >50% of this time spent in counseling and care coordination regarding the above problems.  F/u: 1 yr  Signed:  PCrissie Sickles MD           01/29/2019

## 2019-03-06 ENCOUNTER — Encounter: Payer: Self-pay | Admitting: Family Medicine

## 2019-03-06 ENCOUNTER — Other Ambulatory Visit: Payer: Self-pay

## 2019-03-06 NOTE — Telephone Encounter (Signed)
RF request for Nortrel LOV:01/29/19 Next ov: advised to f/u 1 year Last written: n/a  This is a historical med, please advise if appropriate for refill, thanks. Medication pending

## 2019-03-06 NOTE — Telephone Encounter (Signed)
Responded to pt's mychart message notifying this will have to come from her GYN.

## 2019-03-06 NOTE — Telephone Encounter (Signed)
MyChart message read.

## 2019-03-06 NOTE — Telephone Encounter (Signed)
Request sent to PCP. Will notify patient of approval/denial for med

## 2019-03-06 NOTE — Telephone Encounter (Signed)
I'm denying RF. Pt needs to request this RF from her GYN.

## 2019-05-29 MED FILL — SERTRALINE HCL 100 MG TAB: 100 | 90 days supply | Qty: 90 | Fill #0

## 2019-06-13 MED FILL — NORTREL 1-35 TABLET: 1-35 | 21 days supply | Qty: 21 | Fill #0

## 2019-07-04 MED FILL — ALYACEN 1-35-28 TABLET: 1-35 | 84 days supply | Qty: 112 | Fill #0

## 2019-08-29 MED FILL — SERTRALINE HCL 100 MG TAB: 100 | 90 days supply | Qty: 90 | Fill #1

## 2019-09-29 ENCOUNTER — Encounter: Payer: Self-pay | Admitting: Family Medicine

## 2019-09-29 ENCOUNTER — Ambulatory Visit (INDEPENDENT_AMBULATORY_CARE_PROVIDER_SITE_OTHER): Payer: 59 | Admitting: Family Medicine

## 2019-09-29 ENCOUNTER — Other Ambulatory Visit: Payer: Self-pay

## 2019-09-29 VITALS — BP 114/80 | HR 84 | Temp 98.2°F | Resp 16 | Ht 62.5 in | Wt 211.8 lb

## 2019-09-29 DIAGNOSIS — E669 Obesity, unspecified: Secondary | ICD-10-CM

## 2019-09-29 DIAGNOSIS — Z0000 Encounter for general adult medical examination without abnormal findings: Secondary | ICD-10-CM | POA: Diagnosis not present

## 2019-09-29 DIAGNOSIS — E039 Hypothyroidism, unspecified: Secondary | ICD-10-CM | POA: Diagnosis not present

## 2019-09-29 DIAGNOSIS — E038 Other specified hypothyroidism: Secondary | ICD-10-CM

## 2019-09-29 LAB — COMPREHENSIVE METABOLIC PANEL
ALT: 27 U/L (ref 0–35)
AST: 27 U/L (ref 0–37)
Albumin: 4.1 g/dL (ref 3.5–5.2)
Alkaline Phosphatase: 53 U/L (ref 39–117)
BUN: 14 mg/dL (ref 6–23)
CO2: 24 mEq/L (ref 19–32)
Calcium: 9.4 mg/dL (ref 8.4–10.5)
Chloride: 100 mEq/L (ref 96–112)
Creatinine, Ser: 0.87 mg/dL (ref 0.40–1.20)
GFR: 78.96 mL/min (ref >60.00)
Glucose, Bld: 74 mg/dL (ref 70–99)
Potassium: 4.4 mEq/L (ref 3.5–5.1)
Sodium: 134 mEq/L — ABNORMAL LOW (ref 135–145)
Total Bilirubin: 0.5 mg/dL (ref 0.2–1.2)
Total Protein: 7.6 g/dL (ref 6.0–8.3)

## 2019-09-29 LAB — CBC WITH DIFFERENTIAL/PLATELET
Basophils Absolute: 0 10*3/uL (ref 0.0–0.1)
Basophils Relative: 0.5 % (ref 0.0–3.0)
Eosinophils Absolute: 0.1 10*3/uL (ref 0.0–0.7)
Eosinophils Relative: 1.3 % (ref 0.0–5.0)
HCT: 41.3 % (ref 36.0–46.0)
Hemoglobin: 14.1 g/dL (ref 12.0–15.0)
Lymphocytes Relative: 23 % (ref 12.0–46.0)
Lymphs Abs: 2.2 10*3/uL (ref 0.7–4.0)
MCHC: 34.1 g/dL (ref 30.0–36.0)
MCV: 89.4 fl (ref 78.0–100.0)
Monocytes Absolute: 0.6 10*3/uL (ref 0.1–1.0)
Monocytes Relative: 5.9 % (ref 3.0–12.0)
Neutro Abs: 6.6 10*3/uL (ref 1.4–7.7)
Neutrophils Relative %: 69.3 % (ref 43.0–77.0)
Platelets: 327 10*3/uL (ref 150.0–400.0)
RBC: 4.62 Mil/uL (ref 3.87–5.11)
RDW: 13.6 % (ref 11.5–15.5)
WBC: 9.5 10*3/uL (ref 4.0–10.5)

## 2019-09-29 LAB — LIPID PANEL
Cholesterol: 284 mg/dL — ABNORMAL HIGH (ref 0–200)
HDL: 62.2 mg/dL (ref >39.00)
LDL Cholesterol: 189 mg/dL — ABNORMAL HIGH (ref 0–99)
NonHDL: 222.27
Total CHOL/HDL Ratio: 5
Triglycerides: 165 mg/dL — ABNORMAL HIGH (ref 0.0–149.0)
VLDL: 33 mg/dL (ref 0.0–40.0)

## 2019-09-29 LAB — T4, FREE: Free T4: 0.62 ng/dL (ref 0.60–1.60)

## 2019-09-29 LAB — TSH: TSH: 5.66 u[IU]/mL — ABNORMAL HIGH (ref 0.35–4.50)

## 2019-09-29 NOTE — Progress Notes (Signed)
Office Note 09/29/2019  CC:  Chief Complaint  Patient presents with  . Annual Exam    pt is fasting    HPI:  Brittany Cooper is a 24 y.o. White female who is here for annual health maintenance exam. GYN MD is Dr. Stann Mainland.  Doing well. Working from for [employer] still. Walks her dogs for exercise.   Mood and anxiety are ok.  Taking 100 mg sertraline qd.  Past Medical History:  Diagnosis Date  . Frequent headaches   . GAD (generalized anxiety disorder)   . Gout 11/2016  . Hypersomnolence 2019   Noct polysomn + multiple sleep latency testing planned as of Nov 2019 neuro eval.  . Migraines   . PMDD (premenstrual dysphoric disorder) 11/2016  . Subclinical hypothyroidism 02/2018   + TPO ab mildly elevated-->evolving hashimoto's.  TSH back to normal 04/23/2018.    Past Surgical History:  Procedure Laterality Date  . TONSILLECTOMY     child    Family History  Problem Relation Age of Onset  . Hyperlipidemia Father   . Heart disease Father   . Hypertension Father   . Diabetes Father   . Heart attack Father 55  . Arthritis Maternal Grandmother   . Breast cancer Maternal Grandmother   . Hyperlipidemia Maternal Grandmother   . Heart disease Maternal Grandmother   . Hypertension Maternal Grandmother   . Diabetes Maternal Grandmother   . Multiple myeloma Maternal Grandfather   . Arthritis Paternal Grandmother   . Liver disease Paternal Grandfather   . Alcohol abuse Paternal Grandfather     Social History   Socioeconomic History  . Marital status: Single    Spouse name: Not on file  . Number of children: Not on file  . Years of education: Not on file  . Highest education level: Not on file  Occupational History  . Not on file  Tobacco Use  . Smoking status: Never Smoker  . Smokeless tobacco: Never Used  Vaping Use  . Vaping Use: Never used  Substance and Sexual Activity  . Alcohol use: No  . Drug use: No  . Sexual activity: Never  Other Topics Concern  . Not on  file  Social History Narrative   Divorced 2020, no children.   Educ: BA at [employer].   Working for [employer] as of 09/2019.   No tob.   Rare alc.   No drugs.   Social Determinants of Health   Financial Resource Strain:   . Difficulty of Paying Living Expenses:   Food Insecurity:   . Worried About Charity fundraiser in the Last Year:   . Arboriculturist in the Last Year:   Transportation Needs:   . Film/video editor (Medical):   Marland Kitchen Lack of Transportation (Non-Medical):   Physical Activity:   . Days of Exercise per Week:   . Minutes of Exercise per Session:   Stress:   . Feeling of Stress :   Social Connections:   . Frequency of Communication with Friends and Family:   . Frequency of Social Gatherings with Friends and Family:   . Attends Religious Services:   . Active Member of Clubs or Organizations:   . Attends Archivist Meetings:   Marland Kitchen Marital Status:   Intimate Partner Violence:   . Fear of Current or Ex-Partner:   . Emotionally Abused:   Marland Kitchen Physically Abused:   . Sexually Abused:     Outpatient Medications Prior to Visit  Medication  Sig Dispense Refill  . NORTREL 1/35, 21, tablet Take 1 tablet by mouth daily.    . sertraline (ZOLOFT) 100 MG tablet Take 1 tablet (100 mg total) by mouth daily. 90 tablet 3   No facility-administered medications prior to visit.    No Known Allergies  ROS Review of Systems  Constitutional: Negative for appetite change, chills, fatigue and fever.  HENT: Negative for congestion, dental problem, ear pain and sore throat.   Eyes: Negative for discharge, redness and visual disturbance.  Respiratory: Negative for cough, chest tightness, shortness of breath and wheezing.   Cardiovascular: Negative for chest pain, palpitations and leg swelling.  Gastrointestinal: Negative for abdominal pain, blood in stool, diarrhea, nausea and vomiting.  Genitourinary: Negative for difficulty urinating, dysuria, flank pain, frequency, hematuria and  urgency.  Musculoskeletal: Negative for arthralgias, back pain, joint swelling, myalgias and neck stiffness.  Skin: Negative for pallor and rash.  Neurological: Negative for dizziness, speech difficulty, weakness and headaches.  Hematological: Negative for adenopathy. Does not bruise/bleed easily.  Psychiatric/Behavioral: Negative for confusion and sleep disturbance. The patient is not nervous/anxious.    PE; Blood pressure 114/80, pulse 84, temperature 98.2 F (36.8 C), temperature source Temporal, resp. rate 16, height 5' 2.5" (1.588 m), weight 211 lb 12.8 oz (96.1 kg), SpO2 97 %. Body mass index is 38.12 kg/m.  Exam chaperoned by Deveron Furlong, CMA.  Gen: Alert, well appearing.  Patient is oriented to person, place, time, and situation. AFFECT: pleasant, lucid thought and speech. ENT: Ears: EACs clear, normal epithelium.  TMs with good light reflex and landmarks bilaterally.  Eyes: no injection, icteris, swelling, or exudate.  EOMI, PERRLA. Nose: no drainage or turbinate edema/swelling.  No injection or focal lesion.  Mouth: lips without lesion/swelling.  Oral mucosa pink and moist.  Dentition intact and without obvious caries or gingival swelling.  Oropharynx without erythema, exudate, or swelling.  Neck: supple/nontender.  No LAD, mass, or TM.  Carotid pulses 2+ bilaterally, without bruits. CV: RRR, no m/r/g.   LUNGS: CTA bilat, nonlabored resps, good aeration in all lung fields. ABD: soft, NT, ND, BS normal.  No hepatospenomegaly or mass.  No bruits. EXT: no clubbing, cyanosis, or edema.  Musculoskeletal: no joint swelling, erythema, warmth, or tenderness.  ROM of all joints intact. Skin - no sores or suspicious lesions or rashes or color changes   Pertinent labs:  Lab Results  Component Value Date   TSH 4.01 04/23/2018   Lab Results  Component Value Date   WBC 7.2 01/30/2018   HGB 14.1 01/30/2018   HCT 41.1 01/30/2018   MCV 89.2 01/30/2018   PLT 277.0 01/30/2018    Lab Results  Component Value Date   CREATININE 0.78 01/30/2018   BUN 11 01/30/2018   NA 137 01/30/2018   K 4.0 01/30/2018   CL 103 01/30/2018   CO2 24 01/30/2018   Lab Results  Component Value Date   ALT 13 01/30/2018   AST 15 01/30/2018   ALKPHOS 41 01/30/2018   BILITOT 0.5 01/30/2018   Lab Results  Component Value Date   CHOL 278 (H) 11/28/2017   Lab Results  Component Value Date   HDL 86.30 11/28/2017   Lab Results  Component Value Date   LDLCALC 174 (H) 11/28/2017   Lab Results  Component Value Date   TRIG 86.0 11/28/2017   Lab Results  Component Value Date   CHOLHDL 3 11/28/2017    ASSESSMENT AND PLAN:   Health maintenance exam: Reviewed age  and gender appropriate health maintenance issues (prudent diet, regular exercise, health risks of tobacco and excessive alcohol, use of seatbelts, fire alarms in home, use of sunscreen).  Also reviewed age and gender appropriate health screening as well as vaccine recommendations. Vaccines: Tdap UTD.  Covid 19 -->UTD. Labs: fasting HP labs ordered. Cervical ca screening: next pap due 12/2020 per Dr. Stann Mainland. Breast ca screening: start annual mammograms age 57.  An After Visit Summary was printed and given to the patient.  FOLLOW UP:  Return in about 1 year (around 09/28/2020) for annual CPE (fasting).   Signed:  Crissie Sickles, MD           09/29/2019   Signed:  Crissie Sickles, MD           09/29/2019

## 2019-09-29 NOTE — Patient Instructions (Signed)
Health Maintenance, Female Adopting a healthy lifestyle and getting preventive care are important in promoting health and wellness. Ask your health care provider about:  The right schedule for you to have regular tests and exams.  Things you can do on your own to prevent diseases and keep yourself healthy. What should I know about diet, weight, and exercise? Eat a healthy diet   Eat a diet that includes plenty of vegetables, fruits, low-fat dairy products, and lean protein.  Do not eat a lot of foods that are high in solid fats, added sugars, or sodium. Maintain a healthy weight Body mass index (BMI) is used to identify weight problems. It estimates body fat based on height and weight. Your health care provider can help determine your BMI and help you achieve or maintain a healthy weight. Get regular exercise Get regular exercise. This is one of the most important things you can do for your health. Most adults should:  Exercise for at least 150 minutes each week. The exercise should increase your heart rate and make you sweat (moderate-intensity exercise).  Do strengthening exercises at least twice a week. This is in addition to the moderate-intensity exercise.  Spend less time sitting. Even light physical activity can be beneficial. Watch cholesterol and blood lipids Have your blood tested for lipids and cholesterol at 26 years of age, then have this test every 5 years. Have your cholesterol levels checked more often if:  Your lipid or cholesterol levels are high.  You are older than 26 years of age.  You are at high risk for heart disease. What should I know about cancer screening? Depending on your health history and family history, you may need to have cancer screening at various ages. This may include screening for:  Breast cancer.  Cervical cancer.  Colorectal cancer.  Skin cancer.  Lung cancer. What should I know about heart disease, diabetes, and high blood  pressure? Blood pressure and heart disease  High blood pressure causes heart disease and increases the risk of stroke. This is more likely to develop in people who have high blood pressure readings, are of African descent, or are overweight.  Have your blood pressure checked: ? Every 3-5 years if you are 18-39 years of age. ? Every year if you are 40 years old or older. Diabetes Have regular diabetes screenings. This checks your fasting blood sugar level. Have the screening done:  Once every three years after age 40 if you are at a normal weight and have a low risk for diabetes.  More often and at a younger age if you are overweight or have a high risk for diabetes. What should I know about preventing infection? Hepatitis B If you have a higher risk for hepatitis B, you should be screened for this virus. Talk with your health care provider to find out if you are at risk for hepatitis B infection. Hepatitis C Testing is recommended for:  Everyone born from 1945 through 1965.  Anyone with known risk factors for hepatitis C. Sexually transmitted infections (STIs)  Get screened for STIs, including gonorrhea and chlamydia, if: ? You are sexually active and are younger than 26 years of age. ? You are older than 26 years of age and your health care provider tells you that you are at risk for this type of infection. ? Your sexual activity has changed since you were last screened, and you are at increased risk for chlamydia or gonorrhea. Ask your health care provider if   you are at risk.  Ask your health care provider about whether you are at high risk for HIV. Your health care provider may recommend a prescription medicine to help prevent HIV infection. If you choose to take medicine to prevent HIV, you should first get tested for HIV. You should then be tested every 3 months for as long as you are taking the medicine. Pregnancy  If you are about to stop having your period (premenopausal) and  you may become pregnant, seek counseling before you get pregnant.  Take 400 to 800 micrograms (mcg) of folic acid every day if you become pregnant.  Ask for birth control (contraception) if you want to prevent pregnancy. Osteoporosis and menopause Osteoporosis is a disease in which the bones lose minerals and strength with aging. This can result in bone fractures. If you are 65 years old or older, or if you are at risk for osteoporosis and fractures, ask your health care provider if you should:  Be screened for bone loss.  Take a calcium or vitamin D supplement to lower your risk of fractures.  Be given hormone replacement therapy (HRT) to treat symptoms of menopause. Follow these instructions at home: Lifestyle  Do not use any products that contain nicotine or tobacco, such as cigarettes, e-cigarettes, and chewing tobacco. If you need help quitting, ask your health care provider.  Do not use street drugs.  Do not share needles.  Ask your health care provider for help if you need support or information about quitting drugs. Alcohol use  Do not drink alcohol if: ? Your health care provider tells you not to drink. ? You are pregnant, may be pregnant, or are planning to become pregnant.  If you drink alcohol: ? Limit how much you use to 0-1 drink a day. ? Limit intake if you are breastfeeding.  Be aware of how much alcohol is in your drink. In the [place] one drink equals one 12 oz bottle of beer (355 mL), one 5 oz glass of wine (148 mL), or one 1 oz glass of hard liquor (44 mL). General instructions  Schedule regular health, dental, and eye exams.  Stay current with your vaccines.  Tell your health care provider if: ? You often feel depressed. ? You have ever been abused or do not feel safe at home. Summary  Adopting a healthy lifestyle and getting preventive care are important in promoting health and wellness.  Follow your health care provider's instructions about healthy  diet, exercising, and getting tested or screened for diseases.  Follow your health care provider's instructions on monitoring your cholesterol and blood pressure. This information is not intended to replace advice given to you by your health care provider. Make sure you discuss any questions you have with your health care provider. Document Revised: 03/13/2018 Document Reviewed: 03/13/2018 Elsevier Patient Education  2020 [employer]  

## 2019-09-30 LAB — T3: T3, Total: 126 ng/dL (ref 76–181)

## 2019-09-30 MED FILL — ALYACEN 1-35-28 TABLET: 1-35 | 84 days supply | Qty: 112 | Fill #1

## 2019-10-01 ENCOUNTER — Other Ambulatory Visit: Payer: Self-pay

## 2019-10-01 ENCOUNTER — Encounter: Payer: Self-pay | Admitting: Family Medicine

## 2019-10-01 DIAGNOSIS — E782 Mixed hyperlipidemia: Secondary | ICD-10-CM

## 2019-10-01 MED ORDER — ATORVASTATIN CALCIUM 20 MG PO TABS: 20.0000 mg | ORAL_TABLET | Freq: Every day | ORAL | 2 refills | Status: DC

## 2019-10-01 MED FILL — ATORVASTATIN 20 MG TABLET: 20 | 30 days supply | Qty: 30 | Fill #0

## 2019-10-03 ENCOUNTER — Encounter: Payer: BC Managed Care – PPO | Admitting: Family Medicine

## 2019-10-26 ENCOUNTER — Other Ambulatory Visit: Payer: Self-pay

## 2019-10-26 ENCOUNTER — Encounter (HOSPITAL_BASED_OUTPATIENT_CLINIC_OR_DEPARTMENT_OTHER): Payer: Self-pay | Admitting: Emergency Medicine

## 2019-10-26 ENCOUNTER — Emergency Department (HOSPITAL_BASED_OUTPATIENT_CLINIC_OR_DEPARTMENT_OTHER)
Admission: EM | Admit: 2019-10-26 | Discharge: 2019-10-26 | Disposition: A | Discharge status: Home / Self Care | Payer: 59 | Attending: Emergency Medicine | Admitting: Emergency Medicine

## 2019-10-26 ENCOUNTER — Emergency Department (HOSPITAL_BASED_OUTPATIENT_CLINIC_OR_DEPARTMENT_OTHER): Payer: 59

## 2019-10-26 DIAGNOSIS — R1011 Right upper quadrant pain: Secondary | ICD-10-CM

## 2019-10-26 DIAGNOSIS — K8050 Calculus of bile duct without cholangitis or cholecystitis without obstruction: Secondary | ICD-10-CM | POA: Insufficient documentation

## 2019-10-26 DIAGNOSIS — K8020 Calculus of gallbladder without cholecystitis without obstruction: Secondary | ICD-10-CM | POA: Diagnosis not present

## 2019-10-26 HISTORY — DX: Pure hypercholesterolemia, unspecified: E78.00

## 2019-10-26 LAB — CBC WITH DIFFERENTIAL/PLATELET
Abs Immature Granulocytes: 0.03 10*3/uL (ref 0.00–0.07)
Basophils Absolute: 0 10*3/uL (ref 0.0–0.1)
Basophils Relative: 0 %
Eosinophils Absolute: 0.2 10*3/uL (ref 0.0–0.5)
Eosinophils Relative: 2 %
HCT: 42 % (ref 36.0–46.0)
Hemoglobin: 14.2 g/dL (ref 12.0–15.0)
Immature Granulocytes: 0 %
Lymphocytes Relative: 22 %
Lymphs Abs: 2.1 10*3/uL (ref 0.7–4.0)
MCH: 30.1 pg (ref 26.0–34.0)
MCHC: 33.8 g/dL (ref 30.0–36.0)
MCV: 89.2 fL (ref 80.0–100.0)
Monocytes Absolute: 0.8 10*3/uL (ref 0.1–1.0)
Monocytes Relative: 9 %
Neutro Abs: 6.4 10*3/uL (ref 1.7–7.7)
Neutrophils Relative %: 67 %
Platelets: 350 10*3/uL (ref 150–400)
RBC: 4.71 MIL/uL (ref 3.87–5.11)
RDW: 13.2 % (ref 11.5–15.5)
WBC: 9.6 10*3/uL (ref 4.0–10.5)
nRBC: 0 % (ref 0.0–0.2)

## 2019-10-26 LAB — LIPID PANEL
Cholesterol: 170 mg/dL (ref 0–200)
HDL: 63 mg/dL (ref >40)
LDL Cholesterol: 83 mg/dL (ref 0–99)
Total CHOL/HDL Ratio: 2.7 RATIO
Triglycerides: 122 mg/dL (ref <150)
VLDL: 24 mg/dL (ref 0–40)

## 2019-10-26 LAB — COMPREHENSIVE METABOLIC PANEL
ALT: 104 U/L — ABNORMAL HIGH (ref 0–44)
AST: 115 U/L — ABNORMAL HIGH (ref 15–41)
Albumin: 3.7 g/dL (ref 3.5–5.0)
Alkaline Phosphatase: 84 U/L (ref 38–126)
Anion gap: 13 (ref 5–15)
BUN: 10 mg/dL (ref 6–20)
CO2: 23 mmol/L (ref 22–32)
Calcium: 9.5 mg/dL (ref 8.9–10.3)
Chloride: 101 mmol/L (ref 98–111)
Creatinine, Ser: 0.95 mg/dL (ref 0.44–1.00)
GFR calc Af Amer: >60 mL/min (ref >60)
GFR calc non Af Amer: >60 mL/min (ref >60)
Glucose, Bld: 101 mg/dL — ABNORMAL HIGH (ref 70–99)
Potassium: 3.7 mmol/L (ref 3.5–5.1)
Sodium: 137 mmol/L (ref 135–145)
Total Bilirubin: 0.8 mg/dL (ref 0.3–1.2)
Total Protein: 8.3 g/dL — ABNORMAL HIGH (ref 6.5–8.1)

## 2019-10-26 LAB — LIPASE, BLOOD: Lipase: 43 U/L (ref 11–51)

## 2019-10-26 MED ORDER — LIDOCAINE VISCOUS HCL 2 % MT SOLN
15.0000 mL | Freq: Once | OROMUCOSAL | Status: DC
  Filled 2019-10-26: qty 15

## 2019-10-26 MED ORDER — ALUM & MAG HYDROXIDE-SIMETH 200-200-20 MG/5ML PO SUSP
30.0000 mL | Freq: Once | ORAL | Status: DC
  Filled 2019-10-26: qty 30

## 2019-10-26 MED ORDER — HYOSCYAMINE SULFATE 0.125 MG SL SUBL
0.2500 mg | SUBLINGUAL_TABLET | Freq: Once | SUBLINGUAL | Status: DC
  Filled 2019-10-26: qty 2

## 2019-10-26 NOTE — ED Provider Notes (Signed)
[facility] EMERGENCY DEPARTMENT Provider Note   CSN: [id number] Arrival date & time: 10/26/19  2703     History Chief Complaint  Patient presents with  . Abdominal Pain    Brittany Cooper is a 24 y.o. female.  Ms. Savard is 24yo female w/ HLD who presents to ED with abdominal pain x1 day. Patient says she had an episode of sharp, epigastric abdominal pain about a month ago that resolved on its own. Reports poor diet at the time. She was dx w/ HLD around same time and started on Lipitor. Reports few more episodes of abdominal pain since then, lasting about an hour. Yesterday, she had another episode during the day. Once she felt better, she had some fried fish for dinner.  Later in evening, the pain was radiating to her back and woke her up from sleep. She has felt nauseous throughout the night, no vomiting. Patient has taken multiple Tums without relief. She also reports dark urine yesterday, did not have much water. Denies fevers, vomiting, diarrhea, constipation.       Past Medical History:  Diagnosis Date  . Frequent headaches   . GAD (generalized anxiety disorder)   . Gout 11/2016  . Hypercholesterolemia   . Hyperlipemia, mixed    recommended statin 09/2019  . Hypersomnolence 2019   Noct polysomn + multiple sleep latency testing planned as of Nov 2019 neuro eval.  . Migraines   . Obesity, Class II, BMI 35-39.9   . PMDD (premenstrual dysphoric disorder) 11/2016  . Subclinical hypothyroidism 02/2018   + TPO ab mildly elevated-->evolving hashimoto's.  TSH back to normal 04/23/2018.    There are no problems to display for this patient.   Past Surgical History:  Procedure Laterality Date  . TONSILLECTOMY     child     OB History   No obstetric history on file.     Family History  Problem Relation Age of Onset  . Hyperlipidemia Father   . Heart disease Father   . Hypertension Father   . Diabetes Father   . Heart attack Father 81  . Arthritis  Maternal Grandmother   . Breast cancer Maternal Grandmother   . Hyperlipidemia Maternal Grandmother   . Heart disease Maternal Grandmother   . Hypertension Maternal Grandmother   . Diabetes Maternal Grandmother   . Multiple myeloma Maternal Grandfather   . Arthritis Paternal Grandmother   . Liver disease Paternal Grandfather   . Alcohol abuse Paternal Grandfather     Social History   Tobacco Use  . Smoking status: Never Smoker  . Smokeless tobacco: Never Used  Vaping Use  . Vaping Use: Never used  Substance Use Topics  . Alcohol use: No  . Drug use: No    Home Medications Prior to Admission medications   Medication Sig Start Date End Date Taking? Authorizing Provider  atorvastatin (LIPITOR) 20 MG tablet Take 1 tablet (20 mg total) by mouth daily. 10/01/19   Tammi Sou, MD  NORTREL 1/35, 21, tablet Take 1 tablet by mouth daily. 01/11/19   [provider]  sertraline (ZOLOFT) 100 MG tablet Take 1 tablet (100 mg total) by mouth daily. 01/29/19   McGowen, Adrian Blackwater, MD    Allergies    Patient has no known allergies.  Review of Systems   Review of Systems  Physical Exam Updated Vital Signs BP (!) 151/110 (BP Location: Right Arm)   Pulse 90   Temp 98.5 F (36.9 C) (Oral)  Resp 17   Ht 5' 3"  (1.6 m)   Wt (!) 95.3 kg   SpO2 97%   BMI 37.20 kg/m   Physical Exam Constitutional:      General: She is awake. She is not in acute distress. HENT:     Head: Normocephalic and atraumatic.  Eyes:     General: Lids are normal.     Conjunctiva/sclera: Conjunctivae normal.  Cardiovascular:     Rate and Rhythm: Normal rate and regular rhythm.     Pulses: Normal pulses.     Heart sounds: Normal heart sounds.  Pulmonary:     Effort: Pulmonary effort is normal.     Breath sounds: Normal breath sounds.  Abdominal:     General: Abdomen is flat. Bowel sounds are normal.     Palpations: Abdomen is soft.     Comments: Epigastric tenderness, no guarding or rebound  tenderness.   Musculoskeletal:     Right lower leg: No edema.     Left lower leg: No edema.  Neurological:     Mental Status: She is alert and oriented to person, place, and time.  Psychiatric:        Behavior: Behavior is cooperative.        Thought Content: Thought content normal.        Cognition and Memory: Cognition normal.    ED Results / Procedures / Treatments   Labs (all labs ordered are listed, but only abnormal results are displayed) Labs Reviewed  COMPREHENSIVE METABOLIC PANEL - Abnormal; Notable for the following components:      Result Value   Glucose, Bld 101 (*)    Total Protein 8.3 (*)    AST 115 (*)    ALT 104 (*)    All other components within normal limits  LIPASE, BLOOD  CBC WITH DIFFERENTIAL/PLATELET  LIPID PANEL    EKG None  Radiology No results found.  Procedures Procedures (including critical care time)  Medications Ordered in ED Medications - No data to display  ED Course  I have reviewed the triage vital signs and the nursing notes.  Pertinent labs & imaging results that were available during my care of the patient were reviewed by me and considered in my medical decision making (see chart for details).   MDM Rules/Calculators/A&P  Patient is 24yo female with HLD who presents with epigastric abdominal pain x1d. On exam, she is afebrile, tender to epigastric palpation. No leukocytosis, lipase normal, elevated LFT's. RUQ u/s in ED - no fluid or gallbladder wall inflammation, stones present, unable to visualize CBD. Given GI cocktail to see if symptoms improve, pending formal RUQ u/s.                      -Patient still in ED during shift change. Further evaluation and management of patient in the ED will be transferred to the day team. Final Clinical Impression(s) / ED Diagnoses Final diagnoses:  None    Rx / DC Orders ED Discharge Orders    None       Sanjuan Dame, MD 10/26/19 0715    Fatima Blank, MD 10/27/19  [phone]

## 2019-10-26 NOTE — ED Provider Notes (Signed)
  Physical Exam  BP (!) 135/98 (BP Location: Right Arm)   Pulse 73   Temp 98.5 F (36.9 C) (Oral)   Resp 16   Ht 5\' 3"  (1.6 m)   Wt (!) 95.3 kg   SpO2 97%   BMI 37.20 kg/m   Physical Exam  ED Course/Procedures     Procedures  MDM  Received patient in signout.  Abdominal pain.  LFTs mildly elevated.  Ultrasound done and showed some gallstones and mildly thickened wall but no tenderness or pericholecystic fluid.  Normal common bile duct.  Patient's pain is resolved.  Nontender.  I think likely biliary colic.  Have follow-up with general surgery as an outpatient.       , MD 10/26/19 [phone]

## 2019-10-26 NOTE — ED Notes (Signed)
Pt politely declined medications that were ordered. She states her pain is a 2/10 and she does not feel she needs them at this time.

## 2019-10-26 NOTE — ED Triage Notes (Signed)
Pt reports epigastric pain that started yesterday around lunch. Pt came on suddenly, and resolved suddenly but has returned. Pt reports being on cholesterol medications. Reports nausea, no vomiting and cold chills. Denies fevers.

## 2019-10-30 ENCOUNTER — Ambulatory Visit: Payer: 59 | Admitting: Family Medicine

## 2019-10-30 MED FILL — ATORVASTATIN 20 MG TABLET: 20 | 30 days supply | Qty: 30 | Fill #1

## 2019-11-01 ENCOUNTER — Emergency Department (HOSPITAL_BASED_OUTPATIENT_CLINIC_OR_DEPARTMENT_OTHER)
Admission: EM | Admit: 2019-11-01 | Discharge: 2019-11-01 | Disposition: A | Discharge status: Home / Self Care | Payer: 59 | Attending: Emergency Medicine | Admitting: Emergency Medicine

## 2019-11-01 ENCOUNTER — Emergency Department (HOSPITAL_BASED_OUTPATIENT_CLINIC_OR_DEPARTMENT_OTHER): Payer: 59

## 2019-11-01 ENCOUNTER — Other Ambulatory Visit: Payer: Self-pay

## 2019-11-01 ENCOUNTER — Encounter (HOSPITAL_BASED_OUTPATIENT_CLINIC_OR_DEPARTMENT_OTHER): Payer: Self-pay | Admitting: Emergency Medicine

## 2019-11-01 DIAGNOSIS — R1011 Right upper quadrant pain: Secondary | ICD-10-CM | POA: Diagnosis present

## 2019-11-01 DIAGNOSIS — K8050 Calculus of bile duct without cholangitis or cholecystitis without obstruction: Secondary | ICD-10-CM | POA: Insufficient documentation

## 2019-11-01 DIAGNOSIS — E02 Subclinical iodine-deficiency hypothyroidism: Secondary | ICD-10-CM | POA: Diagnosis not present

## 2019-11-01 DIAGNOSIS — K8020 Calculus of gallbladder without cholecystitis without obstruction: Secondary | ICD-10-CM | POA: Diagnosis not present

## 2019-11-01 LAB — COMPREHENSIVE METABOLIC PANEL
ALT: 143 U/L — ABNORMAL HIGH (ref 0–44)
AST: 150 U/L — ABNORMAL HIGH (ref 15–41)
Albumin: 3.8 g/dL (ref 3.5–5.0)
Alkaline Phosphatase: 94 U/L (ref 38–126)
Anion gap: 12 (ref 5–15)
BUN: 11 mg/dL (ref 6–20)
CO2: 16 mmol/L — ABNORMAL LOW (ref 22–32)
Calcium: 8.7 mg/dL — ABNORMAL LOW (ref 8.9–10.3)
Chloride: 108 mmol/L (ref 98–111)
Creatinine, Ser: 0.81 mg/dL (ref 0.44–1.00)
GFR calc Af Amer: >60 mL/min (ref >60)
GFR calc non Af Amer: >60 mL/min (ref >60)
Glucose, Bld: 94 mg/dL (ref 70–99)
Potassium: 3.9 mmol/L (ref 3.5–5.1)
Sodium: 136 mmol/L (ref 135–145)
Total Bilirubin: 1.8 mg/dL — ABNORMAL HIGH (ref 0.3–1.2)
Total Protein: 8.4 g/dL — ABNORMAL HIGH (ref 6.5–8.1)

## 2019-11-01 LAB — URINALYSIS, ROUTINE W REFLEX MICROSCOPIC
Glucose, UA: NEGATIVE mg/dL
Hgb urine dipstick: NEGATIVE
Ketones, ur: NEGATIVE mg/dL
Nitrite: NEGATIVE
Protein, ur: NEGATIVE mg/dL
Specific Gravity, Urine: >1.03 — ABNORMAL HIGH (ref 1.005–1.030)
pH: 6 (ref 5.0–8.0)

## 2019-11-01 LAB — URINALYSIS, MICROSCOPIC (REFLEX)

## 2019-11-01 LAB — PREGNANCY, URINE: Preg Test, Ur: NEGATIVE

## 2019-11-01 LAB — CBC
HCT: 41.4 % (ref 36.0–46.0)
Hemoglobin: 14 g/dL (ref 12.0–15.0)
MCH: 30 pg (ref 26.0–34.0)
MCHC: 33.8 g/dL (ref 30.0–36.0)
MCV: 88.7 fL (ref 80.0–100.0)
Platelets: 329 10*3/uL (ref 150–400)
RBC: 4.67 MIL/uL (ref 3.87–5.11)
RDW: 13.2 % (ref 11.5–15.5)
WBC: 7.6 10*3/uL (ref 4.0–10.5)
nRBC: 0 % (ref 0.0–0.2)

## 2019-11-01 LAB — LIPASE, BLOOD: Lipase: 52 U/L — ABNORMAL HIGH (ref 11–51)

## 2019-11-01 MED ORDER — HYDROCODONE-ACETAMINOPHEN 5-325 MG PO TABS: 1.0000 | ORAL_TABLET | Freq: Four times a day (QID) | ORAL | 0 refills | Status: DC | PRN

## 2019-11-01 MED ORDER — ONDANSETRON 4 MG PO TBDP: 4.0000 mg | ORAL_TABLET | ORAL | 0 refills | Status: DC | PRN

## 2019-11-01 MED ORDER — FENTANYL CITRATE (PF) 100 MCG/2ML IJ SOLN
50.0000 ug | INTRAMUSCULAR | Status: DC | PRN
  Administered 2019-11-01: 50 ug via INTRAVENOUS
  Filled 2019-11-01: qty 2

## 2019-11-01 MED ORDER — MORPHINE SULFATE (PF) 4 MG/ML IV SOLN
4.0000 mg | Freq: Once | INTRAVENOUS | Status: AC
  Administered 2019-11-01: 4 mg via INTRAVENOUS
  Filled 2019-11-01: qty 1

## 2019-11-01 MED ORDER — ONDANSETRON HCL 4 MG/2ML IJ SOLN
4.0000 mg | Freq: Once | INTRAMUSCULAR | Status: AC
  Administered 2019-11-01: 4 mg via INTRAVENOUS
  Filled 2019-11-01: qty 2

## 2019-11-01 MED ORDER — OMEPRAZOLE 20 MG PO CPDR
20.0000 mg | DELAYED_RELEASE_CAPSULE | Freq: Every day | ORAL | 0 refills | Status: DC
Start: 2019-11-01 — End: 2020-01-26

## 2019-11-01 MED ORDER — LACTATED RINGERS IV BOLUS
1000.0000 mL | Freq: Once | INTRAVENOUS | Status: AC
  Administered 2019-11-01: 1000 mL via INTRAVENOUS

## 2019-11-01 NOTE — Discharge Instructions (Addendum)
1.  Take omeprazole daily.  Take approximately 30 minutes before you eat or drink anything in the morning. 2.  You may take 1-2 Vicodin tablets every 6 hours for pain control.  You may take Zofran every 4 hours for nausea as needed. 3.  Follow dietary instructions for gallbladder disease. 4.  See the surgeon as planned on Monday.  Return to the emergency department if you get pain episode that cannot be controlled by medications, vomiting, fever or progressively worsening pain.

## 2019-11-01 NOTE — ED Provider Notes (Signed)
[facility] EMERGENCY DEPARTMENT Provider Note   CSN: [id number] Arrival date & time: 11/01/19  0827     History Chief Complaint  Patient presents with  . Abdominal Pain    Brittany Cooper is a 24 y.o. female.  HPI Patient got diagnosed with gallbladder stones approxi-1 week ago.  She reports she had a pain episode at that time.  She has been doing well but then last night got another episode around 530.  She reports she was up with pain all night long.  She has pain in her right upper quadrant and in her right shoulder blade.  He has been extremely nauseated.  No fevers.  No pain burning urgency with urination.  No shortness of breath or cough.  She reports that at the time she was seen she did not think she needed to get any pain medications.  She has been trying Tylenol and ibuprofen without relief.  She is scheduled to see outpatient surgery for consultation on Monday.    Past Medical History:  Diagnosis Date  . Frequent headaches   . GAD (generalized anxiety disorder)   . Gout 11/2016  . Hypercholesterolemia   . Hyperlipemia, mixed    recommended statin 09/2019  . Hypersomnolence 2019   Noct polysomn + multiple sleep latency testing planned as of Nov 2019 neuro eval.  . Migraines   . Obesity, Class II, BMI 35-39.9   . PMDD (premenstrual dysphoric disorder) 11/2016  . Subclinical hypothyroidism 02/2018   + TPO ab mildly elevated-->evolving hashimoto's.  TSH back to normal 04/23/2018.    There are no problems to display for this patient.   Past Surgical History:  Procedure Laterality Date  . TONSILLECTOMY     child     OB History   No obstetric history on file.     Family History  Problem Relation Age of Onset  . Hyperlipidemia Father   . Heart disease Father   . Hypertension Father   . Diabetes Father   . Heart attack Father 32  . Arthritis Maternal Grandmother   . Breast cancer Maternal Grandmother   . Hyperlipidemia Maternal Grandmother   .  Heart disease Maternal Grandmother   . Hypertension Maternal Grandmother   . Diabetes Maternal Grandmother   . Multiple myeloma Maternal Grandfather   . Arthritis Paternal Grandmother   . Liver disease Paternal Grandfather   . Alcohol abuse Paternal Grandfather     Social History   Tobacco Use  . Smoking status: Never Smoker  . Smokeless tobacco: Never Used  Vaping Use  . Vaping Use: Never used  Substance Use Topics  . Alcohol use: No  . Drug use: No    Home Medications Prior to Admission medications   Medication Sig Start Date End Date Taking? Authorizing Provider  atorvastatin (LIPITOR) 20 MG tablet Take 1 tablet (20 mg total) by mouth daily. 10/01/19  Yes McGowen, Adrian Blackwater, MD  NORTREL 1/35, 21, tablet Take 1 tablet by mouth daily. 01/11/19  Yes [provider]  sertraline (ZOLOFT) 100 MG tablet Take 1 tablet (100 mg total) by mouth daily. 01/29/19  Yes McGowen, Adrian Blackwater, MD  HYDROcodone-acetaminophen (NORCO/VICODIN) 5-325 MG tablet Take 1-2 tablets by mouth every 6 (six) hours as needed for moderate pain or severe pain. 11/01/19   Charlesetta Shanks, MD  omeprazole (PRILOSEC) 20 MG capsule Take 1 capsule (20 mg total) by mouth daily. 11/01/19   Charlesetta Shanks, MD  ondansetron (ZOFRAN ODT) 4 MG disintegrating tablet  Take 1 tablet (4 mg total) by mouth every 4 (four) hours as needed for nausea or vomiting. 11/01/19   Charlesetta Shanks, MD    Allergies    Patient has no known allergies.  Review of Systems   Review of Systems 10 systems reviewed and negative except as per HPI Physical Exam Updated Vital Signs BP (!) 143/94 (BP Location: Right Arm)   Pulse 76   Temp 97.9 F (36.6 C) (Oral)   Resp 16   Ht 5' 3"  (1.6 m)   Wt (!) 95.3 kg   SpO2 100%   BMI 37.20 kg/m   Physical Exam Constitutional:      Comments: Alert and nontoxic.  Well in appearance.  Moderate pain.  HENT:     Head: Normocephalic and atraumatic.  Eyes:     Extraocular Movements: Extraocular  movements intact.     Conjunctiva/sclera: Conjunctivae normal.  Cardiovascular:     Rate and Rhythm: Normal rate and regular rhythm.  Pulmonary:     Effort: Pulmonary effort is normal.     Breath sounds: Normal breath sounds.  Abdominal:     Comments: Right upper quadrant pain to palpation.  No guarding.  Epigastric pain to palpation no guarding.  Lower abdomen nontender.  Musculoskeletal:        General: No swelling or tenderness. Normal range of motion.     Right lower leg: No edema.     Left lower leg: No edema.  Skin:    General: Skin is warm and dry.  Neurological:     General: No focal deficit present.     Mental Status: She is oriented to person, place, and time.     Coordination: Coordination normal.  Psychiatric:        Mood and Affect: Mood normal.     ED Results / Procedures / Treatments   Labs (all labs ordered are listed, but only abnormal results are displayed) Labs Reviewed  LIPASE, BLOOD - Abnormal; Notable for the following components:      Result Value   Lipase 52 (*)    All other components within normal limits  COMPREHENSIVE METABOLIC PANEL - Abnormal; Notable for the following components:   CO2 16 (*)    Calcium 8.7 (*)    Total Protein 8.4 (*)    AST 150 (*)    ALT 143 (*)    Total Bilirubin 1.8 (*)    All other components within normal limits  URINALYSIS, ROUTINE W REFLEX MICROSCOPIC - Abnormal; Notable for the following components:   APPearance HAZY (*)    Specific Gravity, Urine >1.030 (*)    Bilirubin Urine MODERATE (*)    Leukocytes,Ua TRACE (*)    All other components within normal limits  URINALYSIS, MICROSCOPIC (REFLEX) - Abnormal; Notable for the following components:   Bacteria, UA MANY (*)    All other components within normal limits  URINE CULTURE  CBC  PREGNANCY, URINE    EKG None  Radiology US Abdomen Limited RUQ  Result Date: 11/01/2019 CLINICAL DATA:  24 year old female with epigastric and right upper quadrant pain  EXAM: ULTRASOUND ABDOMEN LIMITED RIGHT UPPER QUADRANT COMPARISON:  Prior abdominal ultrasound 10/26/2019 FINDINGS: Gallbladder: Mobile echogenic foci with posterior acoustic shadowing again noted in the gallbladder consistent with cholelithiasis. The largest individual stone measures up to 0.9 cm. Common bile duct: Diameter: 4 mm Liver: No focal lesion identified. Within normal limits in parenchymal echogenicity. Portal vein is patent on color Doppler imaging with normal direction of  blood flow towards the liver. Other: None. IMPRESSION: Cholelithiasis without secondary sonographic findings to suggest acute cholecystitis. Electronically Signed   By: Jacqulynn Cadet M.D.   On: 11/01/2019 11:52    Procedures Procedures (including critical care time)  Medications Ordered in ED Medications  fentaNYL (SUBLIMAZE) injection 50 mcg (50 mcg Intravenous Given 11/01/19 0956)  ondansetron (ZOFRAN) injection 4 mg (4 mg Intravenous Given 11/01/19 0956)  lactated ringers bolus 1,000 mL (0 mLs Intravenous Stopped 11/01/19 1214)  morphine 4 MG/ML injection 4 mg (4 mg Intravenous Given 11/01/19 1024)  lactated ringers bolus 1,000 mL ( Intravenous Stopped 11/01/19 1325)    ED Course  I have reviewed the triage vital signs and the nursing notes.  Pertinent labs & imaging results that were available during my care of the patient were reviewed by me and considered in my medical decision making (see chart for details).    MDM Rules/Calculators/A&P                          Repeat ultrasound does not show evidence of cholecystitis.  Patient does not have fever or leukocytosis.  Patient was completely pain controlled with hydration, morphine and Zofran.  Stable at this time for follow-up with surgery on Monday as planned.  Return precautions reviewed. Final Clinical Impression(s) / ED Diagnoses Final diagnoses:  Biliary colic    Rx / DC Orders ED Discharge Orders         Ordered    HYDROcodone-acetaminophen  (NORCO/VICODIN) 5-325 MG tablet  Every 6 hours PRN     Discontinue  Reprint     11/01/19 1421    ondansetron (ZOFRAN ODT) 4 MG disintegrating tablet  Every 4 hours PRN     Discontinue  Reprint     11/01/19 1421    omeprazole (PRILOSEC) 20 MG capsule  Daily     Discontinue  Reprint     11/01/19 1421           Charlesetta Shanks, MD 11/01/19 1429

## 2019-11-01 NOTE — ED Triage Notes (Signed)
RUQ pain since yesterday with nausea. She has a surgical consult for her gallbladder on Monday.

## 2019-11-02 DIAGNOSIS — Z8719 Personal history of other diseases of the digestive system: Secondary | ICD-10-CM

## 2019-11-02 HISTORY — DX: Personal history of other diseases of the digestive system: Z87.19

## 2019-11-02 LAB — URINE CULTURE: Culture: <10000 — AB

## 2019-11-03 ENCOUNTER — Other Ambulatory Visit: Payer: Self-pay

## 2019-11-03 ENCOUNTER — Encounter (HOSPITAL_BASED_OUTPATIENT_CLINIC_OR_DEPARTMENT_OTHER): Payer: Self-pay | Admitting: General Surgery

## 2019-11-03 ENCOUNTER — Other Ambulatory Visit: Payer: Self-pay | Admitting: General Surgery

## 2019-11-03 DIAGNOSIS — K8020 Calculus of gallbladder without cholecystitis without obstruction: Secondary | ICD-10-CM | POA: Diagnosis not present

## 2019-11-04 ENCOUNTER — Other Ambulatory Visit (HOSPITAL_COMMUNITY)
Admission: RE | Admit: 2019-11-04 | Discharge: 2019-11-04 | Disposition: A | Discharge status: Home / Self Care | Payer: 59 | Source: Ambulatory Visit | Attending: General Surgery | Admitting: General Surgery

## 2019-11-04 ENCOUNTER — Encounter (HOSPITAL_BASED_OUTPATIENT_CLINIC_OR_DEPARTMENT_OTHER)
Admission: RE | Admit: 2019-11-04 | Discharge: 2019-11-04 | Disposition: A | Discharge status: Home / Self Care | Payer: 59 | Source: Ambulatory Visit | Attending: General Surgery | Admitting: General Surgery

## 2019-11-04 DIAGNOSIS — Z793 Long term (current) use of hormonal contraceptives: Secondary | ICD-10-CM | POA: Diagnosis not present

## 2019-11-04 DIAGNOSIS — E669 Obesity, unspecified: Secondary | ICD-10-CM | POA: Diagnosis not present

## 2019-11-04 DIAGNOSIS — K8010 Calculus of gallbladder with chronic cholecystitis without obstruction: Secondary | ICD-10-CM | POA: Diagnosis not present

## 2019-11-04 DIAGNOSIS — Z79899 Other long term (current) drug therapy: Secondary | ICD-10-CM | POA: Diagnosis not present

## 2019-11-04 DIAGNOSIS — K8050 Calculus of bile duct without cholangitis or cholecystitis without obstruction: Secondary | ICD-10-CM | POA: Diagnosis present

## 2019-11-04 DIAGNOSIS — F419 Anxiety disorder, unspecified: Secondary | ICD-10-CM | POA: Diagnosis not present

## 2019-11-04 DIAGNOSIS — F329 Major depressive disorder, single episode, unspecified: Secondary | ICD-10-CM | POA: Diagnosis not present

## 2019-11-04 DIAGNOSIS — Z6837 Body mass index (BMI) 37.0-37.9, adult: Secondary | ICD-10-CM | POA: Diagnosis not present

## 2019-11-04 DIAGNOSIS — Z01812 Encounter for preprocedural laboratory examination: Secondary | ICD-10-CM | POA: Insufficient documentation

## 2019-11-04 DIAGNOSIS — Z20822 Contact with and (suspected) exposure to covid-19: Secondary | ICD-10-CM | POA: Insufficient documentation

## 2019-11-04 DIAGNOSIS — E7800 Pure hypercholesterolemia, unspecified: Secondary | ICD-10-CM | POA: Diagnosis not present

## 2019-11-04 DIAGNOSIS — K219 Gastro-esophageal reflux disease without esophagitis: Secondary | ICD-10-CM | POA: Diagnosis not present

## 2019-11-04 LAB — COMPREHENSIVE METABOLIC PANEL
ALT: 102 U/L — ABNORMAL HIGH (ref 0–44)
AST: 62 U/L — ABNORMAL HIGH (ref 15–41)
Albumin: 3.6 g/dL (ref 3.5–5.0)
Alkaline Phosphatase: 69 U/L (ref 38–126)
Anion gap: 9 (ref 5–15)
BUN: 9 mg/dL (ref 6–20)
CO2: 21 mmol/L — ABNORMAL LOW (ref 22–32)
Calcium: 9.2 mg/dL (ref 8.9–10.3)
Chloride: 103 mmol/L (ref 98–111)
Creatinine, Ser: 0.88 mg/dL (ref 0.44–1.00)
GFR calc Af Amer: >60 mL/min (ref >60)
GFR calc non Af Amer: >60 mL/min (ref >60)
Glucose, Bld: 113 mg/dL — ABNORMAL HIGH (ref 70–99)
Potassium: 4.6 mmol/L (ref 3.5–5.1)
Sodium: 133 mmol/L — ABNORMAL LOW (ref 135–145)
Total Bilirubin: 0.5 mg/dL (ref 0.3–1.2)
Total Protein: 7.6 g/dL (ref 6.5–8.1)

## 2019-11-04 LAB — SARS CORONAVIRUS 2 (TAT 6-24 HRS): SARS Coronavirus 2: NEGATIVE

## 2019-11-04 MED ORDER — ENSURE PRE-SURGERY PO LIQD: 296.0000 mL | Freq: Once | ORAL | Status: DC

## 2019-11-04 NOTE — Progress Notes (Signed)

## 2019-11-05 LAB — POCT PREGNANCY, URINE: Preg Test, Ur: NEGATIVE

## 2019-11-06 ENCOUNTER — Ambulatory Visit (HOSPITAL_BASED_OUTPATIENT_CLINIC_OR_DEPARTMENT_OTHER): Payer: 59 | Admitting: Certified Registered"

## 2019-11-06 ENCOUNTER — Encounter (HOSPITAL_BASED_OUTPATIENT_CLINIC_OR_DEPARTMENT_OTHER): Payer: Self-pay | Admitting: General Surgery

## 2019-11-06 ENCOUNTER — Ambulatory Visit (HOSPITAL_BASED_OUTPATIENT_CLINIC_OR_DEPARTMENT_OTHER)
Admission: RE | Admit: 2019-11-06 | Discharge: 2019-11-06 | Disposition: A | Discharge status: Home / Self Care | Payer: 59 | Attending: General Surgery | Admitting: General Surgery

## 2019-11-06 ENCOUNTER — Other Ambulatory Visit: Payer: Self-pay

## 2019-11-06 ENCOUNTER — Encounter (HOSPITAL_BASED_OUTPATIENT_CLINIC_OR_DEPARTMENT_OTHER)
Admission: RE | Disposition: A | Discharge status: Home / Self Care | Payer: Self-pay | Source: Home / Self Care | Attending: General Surgery

## 2019-11-06 DIAGNOSIS — Z793 Long term (current) use of hormonal contraceptives: Secondary | ICD-10-CM | POA: Diagnosis not present

## 2019-11-06 DIAGNOSIS — F329 Major depressive disorder, single episode, unspecified: Secondary | ICD-10-CM | POA: Diagnosis not present

## 2019-11-06 DIAGNOSIS — Z79899 Other long term (current) drug therapy: Secondary | ICD-10-CM | POA: Diagnosis not present

## 2019-11-06 DIAGNOSIS — K219 Gastro-esophageal reflux disease without esophagitis: Secondary | ICD-10-CM | POA: Insufficient documentation

## 2019-11-06 DIAGNOSIS — E669 Obesity, unspecified: Secondary | ICD-10-CM | POA: Insufficient documentation

## 2019-11-06 DIAGNOSIS — Z6837 Body mass index (BMI) 37.0-37.9, adult: Secondary | ICD-10-CM | POA: Diagnosis not present

## 2019-11-06 DIAGNOSIS — E7800 Pure hypercholesterolemia, unspecified: Secondary | ICD-10-CM | POA: Insufficient documentation

## 2019-11-06 DIAGNOSIS — K8020 Calculus of gallbladder without cholecystitis without obstruction: Secondary | ICD-10-CM | POA: Diagnosis not present

## 2019-11-06 DIAGNOSIS — F418 Other specified anxiety disorders: Secondary | ICD-10-CM | POA: Diagnosis not present

## 2019-11-06 DIAGNOSIS — K8010 Calculus of gallbladder with chronic cholecystitis without obstruction: Secondary | ICD-10-CM | POA: Diagnosis not present

## 2019-11-06 DIAGNOSIS — F419 Anxiety disorder, unspecified: Secondary | ICD-10-CM | POA: Diagnosis not present

## 2019-11-06 DIAGNOSIS — G8918 Other acute postprocedural pain: Secondary | ICD-10-CM | POA: Diagnosis not present

## 2019-11-06 HISTORY — PX: CHOLECYSTECTOMY: SHX55

## 2019-11-06 LAB — POCT PREGNANCY, URINE: Preg Test, Ur: NEGATIVE

## 2019-11-06 SURGERY — LAPAROSCOPIC CHOLECYSTECTOMY
Anesthesia: Regional | Site: Abdomen

## 2019-11-06 MED ORDER — DEXAMETHASONE SODIUM PHOSPHATE 10 MG/ML IJ SOLN
INTRAMUSCULAR | Status: DC | PRN
Start: 2019-11-06 — End: 2019-11-06

## 2019-11-06 MED ORDER — FENTANYL CITRATE (PF) 100 MCG/2ML IJ SOLN
INTRAMUSCULAR | Status: AC
  Filled 2019-11-06: qty 2

## 2019-11-06 MED ORDER — MIDAZOLAM HCL 2 MG/2ML IJ SOLN
INTRAMUSCULAR | Status: AC
  Filled 2019-11-06: qty 2

## 2019-11-06 MED ORDER — FENTANYL CITRATE (PF) 100 MCG/2ML IJ SOLN
25.0000 ug | INTRAMUSCULAR | Status: DC | PRN
  Administered 2019-11-06: 50 ug via INTRAVENOUS

## 2019-11-06 MED ORDER — MIDAZOLAM HCL 2 MG/2ML IJ SOLN
2.0000 mg | Freq: Once | INTRAMUSCULAR | Status: AC
  Administered 2019-11-06: 2 mg via INTRAVENOUS

## 2019-11-06 MED ORDER — MIDAZOLAM HCL 5 MG/5ML IJ SOLN
INTRAMUSCULAR | Status: DC | PRN
  Administered 2019-11-06: 2 mg via INTRAVENOUS

## 2019-11-06 MED ORDER — ACETAMINOPHEN 500 MG PO TABS
1000.0000 mg | ORAL_TABLET | ORAL | Status: AC
  Administered 2019-11-06: 1000 mg via ORAL

## 2019-11-06 MED ORDER — LACTATED RINGERS IV SOLN: INTRAVENOUS | Status: DC

## 2019-11-06 MED ORDER — KETOROLAC TROMETHAMINE 15 MG/ML IJ SOLN
INTRAMUSCULAR | Status: AC
  Filled 2019-11-06: qty 1

## 2019-11-06 MED ORDER — KETOROLAC TROMETHAMINE 15 MG/ML IJ SOLN
15.0000 mg | INTRAMUSCULAR | Status: AC
  Administered 2019-11-06: 15 mg via INTRAVENOUS

## 2019-11-06 MED ORDER — CEFAZOLIN SODIUM-DEXTROSE 2-4 GM/100ML-% IV SOLN
INTRAVENOUS | Status: AC
  Filled 2019-11-06: qty 100

## 2019-11-06 MED ORDER — SPY AGENT GREEN - (INDOCYANINE FOR INJECTION)
INTRAMUSCULAR | Status: DC | PRN
  Administered 2019-11-06: 3 mL via INTRAVENOUS

## 2019-11-06 MED ORDER — SODIUM CHLORIDE 0.9 % IV SOLN
INTRAVENOUS | Status: AC | PRN
  Administered 2019-11-06: 1000 mL

## 2019-11-06 MED ORDER — ONDANSETRON HCL 4 MG/2ML IJ SOLN
INTRAMUSCULAR | Status: DC | PRN
  Administered 2019-11-06: 4 mg via INTRAVENOUS

## 2019-11-06 MED ORDER — ACETAMINOPHEN 500 MG PO TABS
ORAL_TABLET | ORAL | Status: AC
  Filled 2019-11-06: qty 2

## 2019-11-06 MED ORDER — SUGAMMADEX SODIUM 200 MG/2ML IV SOLN
INTRAVENOUS | Status: DC | PRN
Start: 2019-11-06 — End: 2019-11-06
  Administered 2019-11-06: 200 mg via INTRAVENOUS

## 2019-11-06 MED ORDER — OXYCODONE HCL 5 MG PO TABS: 5.0000 mg | ORAL_TABLET | Freq: Four times a day (QID) | ORAL | 0 refills | Status: DC | PRN

## 2019-11-06 MED ORDER — OXYCODONE HCL 5 MG PO TABS
5.0000 mg | ORAL_TABLET | Freq: Once | ORAL | Status: AC | PRN
  Administered 2019-11-06: 5 mg via ORAL

## 2019-11-06 MED ORDER — PROPOFOL 10 MG/ML IV BOLUS
INTRAVENOUS | Status: DC | PRN
  Administered 2019-11-06: 150 mg via INTRAVENOUS

## 2019-11-06 MED ORDER — LIDOCAINE HCL (CARDIAC) PF 100 MG/5ML IV SOSY
PREFILLED_SYRINGE | INTRAVENOUS | Status: DC | PRN
  Administered 2019-11-06: 30 mg via INTRAVENOUS

## 2019-11-06 MED ORDER — OXYCODONE HCL 5 MG PO TABS
ORAL_TABLET | ORAL | Status: AC
  Filled 2019-11-06: qty 1

## 2019-11-06 MED ORDER — ROPIVACAINE HCL 5 MG/ML IJ SOLN
INTRAMUSCULAR | Status: DC | PRN
  Administered 2019-11-06: 30 mL via PERINEURAL
  Administered 2019-11-06: 25 mL via PERINEURAL

## 2019-11-06 MED ORDER — GABAPENTIN 100 MG PO CAPS
ORAL_CAPSULE | ORAL | Status: AC
  Filled 2019-11-06: qty 1

## 2019-11-06 MED ORDER — GABAPENTIN 100 MG PO CAPS
100.0000 mg | ORAL_CAPSULE | ORAL | Status: AC
  Administered 2019-11-06: 100 mg via ORAL

## 2019-11-06 MED ORDER — DEXAMETHASONE SODIUM PHOSPHATE 4 MG/ML IJ SOLN
INTRAMUSCULAR | Status: DC | PRN
  Administered 2019-11-06: 5 mg via INTRAVENOUS

## 2019-11-06 MED ORDER — FENTANYL CITRATE (PF) 100 MCG/2ML IJ SOLN
INTRAMUSCULAR | Status: DC | PRN
  Administered 2019-11-06: 50 ug via INTRAVENOUS

## 2019-11-06 MED ORDER — BUPIVACAINE HCL (PF) 0.25 % IJ SOLN
INTRAMUSCULAR | Status: DC | PRN
  Administered 2019-11-06: 12 mL

## 2019-11-06 MED ORDER — FENTANYL CITRATE (PF) 100 MCG/2ML IJ SOLN
100.0000 ug | Freq: Once | INTRAMUSCULAR | Status: AC
  Administered 2019-11-06: 100 ug via INTRAVENOUS

## 2019-11-06 MED ORDER — ROCURONIUM BROMIDE 100 MG/10ML IV SOLN
INTRAVENOUS | Status: DC | PRN
  Administered 2019-11-06: 80 mg via INTRAVENOUS

## 2019-11-06 MED ORDER — CEFAZOLIN SODIUM-DEXTROSE 2-4 GM/100ML-% IV SOLN
2.0000 g | INTRAVENOUS | Status: AC
  Administered 2019-11-06: 2 g via INTRAVENOUS

## 2019-11-06 MED ORDER — DEXAMETHASONE SODIUM PHOSPHATE 10 MG/ML IJ SOLN
INTRAMUSCULAR | Status: DC | PRN
Start: 2019-11-06 — End: 2019-11-06
  Administered 2019-11-06 (×2): 5 mg

## 2019-11-06 SURGICAL SUPPLY — 52 items
ADH SKN CLS APL DERMABOND .7 (GAUZE/BANDAGES/DRESSINGS) ×2
APL PRP STRL LF DISP 70% ISPRP (MISCELLANEOUS) ×2
APPLIER CLIP 5 13 M/L LIGAMAX5 (MISCELLANEOUS) ×4
APR CLP MED LRG 5 ANG JAW (MISCELLANEOUS) ×2
BAG SPEC RTRVL 10 TROC 200 (ENDOMECHANICALS) ×2
BLADE CLIPPER SURG (BLADE) IMPLANT
CHLORAPREP W/TINT 26 (MISCELLANEOUS) ×4 IMPLANT
CLIP APPLIE 5 13 M/L LIGAMAX5 (MISCELLANEOUS) ×2 IMPLANT
CLOSURE WOUND 1/2 X4 (GAUZE/BANDAGES/DRESSINGS) ×1
COVER MAYO STAND STRL (DRAPES) IMPLANT
COVER WAND RF STERILE (DRAPES) IMPLANT
DECANTER SPIKE VIAL GLASS SM (MISCELLANEOUS) IMPLANT
DERMABOND ADVANCED (GAUZE/BANDAGES/DRESSINGS) ×2
DERMABOND ADVANCED .7 DNX12 (GAUZE/BANDAGES/DRESSINGS) ×2 IMPLANT
DEVICE TROCAR PUNCTURE CLOSURE (ENDOMECHANICALS) IMPLANT
DRAPE C-ARM 42X72 X-RAY (DRAPES) IMPLANT
DRAPE LAPAROSCOPIC ABDOMINAL (DRAPES) ×4 IMPLANT
ELECT REM PT RETURN 9FT ADLT (ELECTROSURGICAL) ×4
ELECTRODE REM PT RTRN 9FT ADLT (ELECTROSURGICAL) ×2 IMPLANT
GLOVE BIO SURGEON STRL SZ 6.5 (GLOVE) ×2 IMPLANT
GLOVE BIO SURGEON STRL SZ7 (GLOVE) ×4 IMPLANT
GLOVE BIO SURGEONS STRL SZ 6.5 (GLOVE) ×1
GLOVE BIOGEL M 6.5 STRL (GLOVE) ×3 IMPLANT
GLOVE BIOGEL PI IND STRL 6.5 (GLOVE) ×1 IMPLANT
GLOVE BIOGEL PI IND STRL 7.0 (GLOVE) ×2 IMPLANT
GLOVE BIOGEL PI IND STRL 7.5 (GLOVE) ×2 IMPLANT
GLOVE BIOGEL PI INDICATOR 6.5 (GLOVE) ×2
GLOVE BIOGEL PI INDICATOR 7.0 (GLOVE) ×4
GLOVE BIOGEL PI INDICATOR 7.5 (GLOVE) ×2
GOWN STRL REUS W/ TWL LRG LVL3 (GOWN DISPOSABLE) ×6 IMPLANT
GOWN STRL REUS W/TWL LRG LVL3 (GOWN DISPOSABLE) ×12
GRASPER SUT TROCAR 14GX15 (MISCELLANEOUS) ×3 IMPLANT
HEMOSTAT SNOW SURGICEL 2X4 (HEMOSTASIS) IMPLANT
NS IRRIG 1000ML POUR BTL (IV SOLUTION) ×4 IMPLANT
PACK BASIN DAY SURGERY FS (CUSTOM PROCEDURE TRAY) ×4 IMPLANT
POUCH RETRIEVAL ECOSAC 10 (ENDOMECHANICALS) ×2 IMPLANT
POUCH RETRIEVAL ECOSAC 10MM (ENDOMECHANICALS) ×4
SCISSORS LAP 5X35 DISP (ENDOMECHANICALS) ×4 IMPLANT
SET CHOLANGIOGRAPH 5 50 .035 (SET/KITS/TRAYS/PACK) IMPLANT
SET IRRIG TUBING LAPAROSCOPIC (IRRIGATION / IRRIGATOR) ×4 IMPLANT
SET TUBE SMOKE EVAC HIGH FLOW (TUBING) ×4 IMPLANT
SLEEVE ENDOPATH XCEL 5M (ENDOMECHANICALS) ×8 IMPLANT
SLEEVE SCD COMPRESS KNEE MED (MISCELLANEOUS) ×4 IMPLANT
STRIP CLOSURE SKIN 1/2X4 (GAUZE/BANDAGES/DRESSINGS) ×3 IMPLANT
SUT MNCRL AB 4-0 PS2 18 (SUTURE) ×4 IMPLANT
SUT VICRYL 0 UR6 27IN ABS (SUTURE) ×3 IMPLANT
TOWEL GREEN STERILE FF (TOWEL DISPOSABLE) ×8 IMPLANT
TRAY LAPAROSCOPIC (CUSTOM PROCEDURE TRAY) ×4 IMPLANT
TROCAR XCEL BLUNT TIP 100MML (ENDOMECHANICALS) ×4 IMPLANT
TROCAR XCEL NON-BLD 5MMX100MML (ENDOMECHANICALS) ×4 IMPLANT
TUBE CONNECTING 20'X1/4 (TUBING) ×1
TUBE CONNECTING 20X1/4 (TUBING) ×2 IMPLANT

## 2019-11-06 NOTE — Discharge Instructions (Signed)
[facility] LAP[facility]: POST OP INSTRUCTIONS  Always review your discharge instruction sheet given to you by the facility where your surgery was performed. IF YOU HAVE DISABILITY OR FAMILY LEAVE FORMS, YOU MUST BRING THEM TO THE OFFICE FOR PROCESSING.   DO NOT GIVE THEM TO YOUR DOCTOR.  1. A prescription for pain medication may be given to you upon discharge.  Take your pain medication as prescribed, if needed.  If narcotic pain medicine is not needed, then you may take acetaminophen (Tylenol), naprosyn (Alleve), or ibuprofen (Advil) as needed. Do not take Tylenol or Ibuprofen until 8pm as needed. 2. Take your usually prescribed medications unless otherwise directed. 3. If you need a refill on your pain medication, please contact your pharmacy.  They will contact our office to request authorization. Prescriptions will not be filled after 5pm or on week-ends. 4. You should follow a light diet the first few days after arrival home, such as soup and crackers, etc.  Be sure to include lots of fluids daily. 5. Most patients will experience some swelling and bruising in the area of the incisions.  Ice packs will help.  Swelling and bruising can take several days to resolve.  6. It is common to experience some constipation if taking pain medication after surgery.  Increasing fluid intake and taking a stool softener (such as Colace) will usually help or prevent this problem from occurring.  A mild laxative (Milk of Magnesia or Miralax) should be taken according to package instructions if there are no bowel movements after 48 hours. 7. Unless discharge instructions indicate otherwise, you may remove your bandages 48 hours after surgery, and you may shower at that time.  You may have steri-strips (small skin tapes) in place directly over the incision.  These strips should be left on the skin for 7-10 days.  If your surgeon used skin glue on the  incision, you may shower in 24 hours.  The glue will flake off over the next 2-3 weeks.  Any sutures or staples will be removed at the office during your follow-up visit. 8. ACTIVITIES:  You may resume regular (light) daily activities beginning the next day--such as daily self-care, walking, climbing stairs--gradually increasing activities as tolerated.  You may have sexual intercourse when it is comfortable.  Refrain from any heavy lifting or straining until approved by your doctor. a. You may drive when you are no longer taking prescription pain medication, you can comfortably wear a seatbelt, and you can safely maneuver your car and apply brakes. b. RETURN TO WORK:  __________________________________________________________ 9. You should see your doctor in the office for a follow-up appointment approximately 2-3 weeks after your surgery.  Make sure that you call for this appointment within a day or two after you arrive home to insure a convenient appointment time. 10. OTHER INSTRUCTIONS: __________________________________________________________________________________________________________________________ __________________________________________________________________________________________________________________________ WHEN TO CALL YOUR DOCTOR: 1. Fever over 101.0 2. Inability to urinate 3. Continued bleeding from incision. 4. Increased pain, redness, or drainage from the incision. 5. Increasing abdominal pain  The clinic staff is available to answer your questions during regular business hours.  Please don't hesitate to call and ask to speak to one of the nurses for clinical concerns.  If you have a medical emergency, go to the nearest emergency room or call 911.  A surgeon from [facility] is always on call at the hospital. [address], [place], [place]  [address] ? [address] [address], [place], [place]  [address] [phone] ? [phone] ? FAX [phone]  [phone] Web site: [email or web address]   Post Anesthesia Home Care Instructions  Activity: Get plenty of rest for the remainder of the day. A responsible individual must stay with you for 24 hours following the procedure.  For the next 24 hours, DO NOT: -Drive a car -Advertising copywriter -Drink alcoholic beverages -Take any medication unless instructed by your physician -Make any legal decisions or sign important papers.  Meals: Start with liquid foods such as gelatin or soup. Progress to regular foods as tolerated. Avoid greasy, spicy, heavy foods. If nausea and/or vomiting occur, drink only clear liquids until the nausea and/or vomiting subsides. Call your physician if vomiting continues.  Special Instructions/Symptoms: Your throat may feel dry or sore from the anesthesia or the breathing tube placed in your throat during surgery. If this causes discomfort, gargle with warm salt water. The discomfort should disappear within 24 hours.  If you had a scopolamine patch placed behind your ear for the management of post- operative nausea and/or vomiting:  1. The medication in the patch is effective for 72 hours, after which it should be removed.  Wrap patch in a tissue and discard in the trash. Wash hands thoroughly with soap and water. 2. You may remove the patch earlier than 72 hours if you experience unpleasant side effects which may include dry mouth, dizziness or visual disturbances. 3. Avoid touching the patch. Wash your hands with soap and water after contact with the patch.

## 2019-11-06 NOTE — Interval H&P Note (Signed)
History and Physical Interval Note:  11/06/2019 2:56 PM  Brittany Cooper  has presented today for surgery, with the diagnosis of gallstones.  The various methods of treatment have been discussed with the patient and family. After consideration of risks, benefits and other options for treatment, the patient has consented to  Procedure(s): LAPAROSCOPIC CHOLECYSTECTOMY WITH INTRAOPERATIVE CHOLANGIOGRAM, ICG GREEN DYE (N/A) as a surgical intervention.  The patient's history has been reviewed, patient examined, no change in status, stable for surgery.  I have reviewed the patient's chart and labs.  Questions were answered to the patient's satisfaction.     Emelia Loron

## 2019-11-06 NOTE — Anesthesia Procedure Notes (Signed)
Anesthesia Regional Block: TAP block   Pre-Anesthetic Checklist: ,, timeout performed, Correct Patient, Correct Site, Correct Laterality, Correct Procedure, Correct Position, site marked, Risks and benefits discussed,  Surgical consent,  Pre-op evaluation,  At surgeon's request and post-op pain management  Laterality: Left  Prep: Maximum Sterile Barrier Precautions used, chloraprep       Needles:  Injection technique: Single-shot  Needle Type: Echogenic Stimulator Needle     Needle Length: 9cm  Needle Gauge: 22     Additional Needles:   Procedures:,,,, ultrasound used (permanent image in chart),,,,  Narrative:  Start time: 11/06/2019 2:53 PM End time: 11/06/2019 3:00 PM Injection made incrementally with aspirations every 5 mL.  Performed by: Personally  Anesthesiologist: Elmer Picker, MD  Additional Notes: Monitors applied. No increased pain on injection. No increased resistance to injection. Injection made in 5cc increments. Good needle visualization. Patient tolerated procedure well.

## 2019-11-06 NOTE — Anesthesia Procedure Notes (Signed)
Procedure Name: Intubation Date/Time: 11/06/2019 3:15 PM Performed by: Signe Colt, CRNA Pre-anesthesia Checklist: Patient identified, Emergency Drugs available, Suction available and Patient being monitored Patient Re-evaluated:Patient Re-evaluated prior to induction Oxygen Delivery Method: Circle system utilized Preoxygenation: Pre-oxygenation with 100% oxygen Induction Type: IV induction Ventilation: Mask ventilation without difficulty Laryngoscope Size: Mac and 3 Grade View: Grade I Tube type: Oral Tube size: 7.0 mm Number of attempts: 1 Airway Equipment and Method: Stylet and Oral airway Placement Confirmation: ETT inserted through vocal cords under direct vision,  positive ETCO2 and breath sounds checked- equal and bilateral Secured at: 21 cm Tube secured with: Tape Dental Injury: Teeth and Oropharynx as per pre-operative assessment

## 2019-11-06 NOTE — Anesthesia Preprocedure Evaluation (Addendum)
Anesthesia Evaluation  Patient identified by MRN, date of birth, ID band Patient awake    Reviewed: Allergy & Precautions, NPO status , Patient's Chart, lab work & pertinent test results  Airway Mallampati: III  TM Distance: >3 FB Neck ROM: Full    Dental no notable dental hx. (+) Teeth Intact, Dental Advisory Given   Pulmonary neg pulmonary ROS,    Pulmonary exam normal breath sounds clear to auscultation       Cardiovascular Normal cardiovascular exam Rhythm:Regular Rate:Normal  HLD   Neuro/Psych  Headaches, PSYCHIATRIC DISORDERS Anxiety Depression    GI/Hepatic Neg liver ROS, GERD  Medicated,  Endo/Other  Hypothyroidism Obese BMI 37  Renal/GU negative Renal ROS  negative genitourinary   Musculoskeletal negative musculoskeletal ROS (+)   Abdominal   Peds  Hematology negative hematology ROS (+)   Anesthesia Other Findings   Reproductive/Obstetrics                           Anesthesia Physical Anesthesia Plan  ASA: II  Anesthesia Plan: General and Regional   Post-op Pain Management:  Regional for Post-op pain   Induction: Intravenous  PONV Risk Score and Plan: 3 and Midazolam, Dexamethasone and Ondansetron  Airway Management Planned: Oral ETT  Additional Equipment:   Intra-op Plan:   Post-operative Plan: Extubation in OR  Informed Consent: I have reviewed the patients History and Physical, chart, labs and discussed the procedure including the risks, benefits and alternatives for the proposed anesthesia with the patient or authorized representative who has indicated his/her understanding and acceptance.     Dental advisory given  Plan Discussed with: CRNA  Anesthesia Plan Comments:         Anesthesia Quick Evaluation

## 2019-11-06 NOTE — Anesthesia Procedure Notes (Signed)
Anesthesia Regional Block: TAP block   Pre-Anesthetic Checklist: ,, timeout performed, Correct Patient, Correct Site, Correct Laterality, Correct Procedure, Correct Position, site marked, Risks and benefits discussed,  Surgical consent,  Pre-op evaluation,  At surgeon's request and post-op pain management  Laterality: Right  Prep: Maximum Sterile Barrier Precautions used, chloraprep       Needles:  Injection technique: Single-shot  Needle Type: Echogenic Stimulator Needle     Needle Length: 9cm  Needle Gauge: 22     Additional Needles:   Procedures:,,,, ultrasound used (permanent image in chart),,,,  Narrative:  Start time: 11/06/2019 2:43 PM End time: 11/06/2019 2:53 PM Injection made incrementally with aspirations every 5 mL.  Performed by: Personally  Anesthesiologist: Elmer Picker, MD  Additional Notes: Monitors applied. No increased pain on injection. No increased resistance to injection. Injection made in 5cc increments. Good needle visualization. Patient tolerated procedure well.

## 2019-11-06 NOTE — Anesthesia Postprocedure Evaluation (Signed)
Anesthesia Post Note  Patient: Brittany Cooper  Procedure(s) Performed: LAPAROSCOPIC CHOLECYSTECTOMY WITH INDOCYANINE GREEN DYE (N/A Abdomen)     Patient location during evaluation: Phase II Anesthesia Type: General Level of consciousness: awake Pain management: pain level controlled Vital Signs Assessment: post-procedure vital signs reviewed and stable Respiratory status: spontaneous breathing Cardiovascular status: stable Postop Assessment: no apparent nausea or vomiting Anesthetic complications: no   No complications documented.  Last Vitals:  Vitals:   11/06/19 1700 11/06/19 1702  BP: (!) 152/103 (!) 148/92  Pulse: 85 91  Resp: 14 (!) 22  Temp:    SpO2: 99% 99%    Last Pain:  Vitals:   11/06/19 1700  TempSrc:   PainSc: 5                  John F Darlette Dubow Jr

## 2019-11-06 NOTE — H&P (Signed)
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 time makes better. has been to ER twice now, last was Saturday. no urinary issues, no issues with bms. she had [place] 7/31 that shows mobile echogenic foci consistent with gallstones largest .9cm, cbd normal, tb was 1.8 up from last er visit. she was sent home for follow up in our office. she has some soreness now but no pain. not eating much  Past Surgical History [place] Lonni Fix, CMA; 11/03/2019 9:04 AM) Tonsillectomy   Diagnostic Studies History ([place] Lonni Fix, CMA; 11/03/2019 9:04 AM) Colonoscopy  never Mammogram  never Pap Smear  1-5 years ago  Allergies (Chan[place] Lonni Fix, CMA; 11/03/2019 9:04 AM) No Known Drug Allergies  [11/03/2019]: Allergies Reconciled   Medication History (Chan[place] Lonni Fix, CMA; 11/03/2019 9:05 AM) Sertraline HCl (100MG  Tablet, Oral) Active. Nortrel 1/35 (21) (1-35MG -MCG Tablet, Oral) Active. Atorvastatin Calcium (20MG  Tablet, Oral) Active. Medications Reconciled  Social History 2/35, CMA; 11/03/2019 9:04 AM) Alcohol use  Occasional alcohol use. Caffeine use  Carbonated beverages, Coffee, Tea. No drug use  Tobacco use  Never smoker.  Family History (Chanel Micheal Likens, CMA; 11/03/2019 9:04 AM) Arthritis  Family Members In General. Breast Cancer  Family Members In General. Cancer  Family Members In General. Diabetes Mellitus  Family Members In General, Father. Heart Disease  Family Members In General, Father. Heart disease in female family member before age 47  Heart disease in female family member before age 77  Hypertension  Family Members In General, Father. Thyroid problems  Family Members In General.  Pregnancy / Birth History 74, CMA; 11/03/2019 9:04 AM) Age at menarche  11 years. Contraceptive History  Oral contraceptives. Gravida  0 Irregular periods  Para  0  Other Problems (Chanel Micheal Likens, CMA;  11/03/2019 9:04 AM) Anxiety Disorder  Cholelithiasis  Gastroesophageal Reflux Disease  Hypercholesterolemia   Review of Systems (Chanel Nolan CMA; 11/03/2019 9:04 AM) General Not Present- Appetite Loss, Chills, Fatigue, Fever, Night Sweats, Weight Gain and Weight Loss. Skin Not Present- Change in Wart/Mole, Dryness, Hives, Jaundice, New Lesions, Non-Healing Wounds, Rash and Ulcer. HEENT Not Present- Earache, Hearing Loss, Hoarseness, Nose Bleed, Oral Ulcers, Ringing in the Ears, Seasonal Allergies, Sinus Pain, Sore Throat, Visual Disturbances, Wears glasses/contact lenses and Yellow Eyes. Respiratory Not Present- Bloody sputum, Chronic Cough, Difficulty Breathing, Snoring and Wheezing. Breast Not Present- Breast Mass, Breast Pain, Nipple Discharge and Skin Changes. Cardiovascular Not Present- Chest Pain, Difficulty Breathing Lying Down, Leg Cramps, Palpitations, Rapid Heart Rate, Shortness of Breath and Swelling of Extremities. Gastrointestinal Present- Abdominal Pain, Bloating and Nausea. Not Present- Bloody Stool, Change in Bowel Habits, Chronic diarrhea, Constipation, Difficulty Swallowing, Excessive gas, Gets full quickly at meals, Hemorrhoids, Indigestion, Rectal Pain and Vomiting. Female Genitourinary Not Present- Frequency, Nocturia, Painful Urination, Pelvic Pain and Urgency. Musculoskeletal Not Present- Back Pain, Joint Pain, Joint Stiffness, Muscle Pain, Muscle Weakness and Swelling of Extremities. Neurological Not Present- Decreased Memory, Fainting, Headaches, Numbness, Seizures, Tingling, Tremor, Trouble walking and Weakness. Psychiatric Not Present- Anxiety, Bipolar, Change in Sleep Pattern, Depression, Fearful and Frequent crying. Endocrine Not Present- Cold Intolerance, Excessive Hunger, Hair Changes, Heat Intolerance, Hot flashes and New Diabetes. Hematology Not Present- Blood Thinners, Easy Bruising, Excessive bleeding, Gland problems, HIV and Persistent Infections.  Vitals  (Chanel Nolan CMA; 11/03/2019 9:05 AM) 11/03/2019 9:05 AM Weight: 213.5 lb Height: 63in Body Surface Area: 1.99 m Body Mass Index: 37.82 kg/m  Temp.: 98.81F  Pulse: 105 (  Regular)  BP: 122/80(Sitting, Left Arm, Standard) Physical Exam Emelia Loron MD; 11/03/2019 9:35 AM) General Mental Status-Alert. Orientation-Oriented X3. Eye Sclera/Conjunctiva - Bilateral-No scleral icterus. Abdomen Note: soft nondistended tender ruq to deep palpation, no murphys sign   Assessment & Plan Emelia Loron MD; 11/03/2019 9:36 AM) GALLSTONES (K80.20) Story: Lap chole  I discussed the procedure in detail. We discussed the risks and benefits of a laparoscopic cholecystectomy and possible cholangiogram including, but not limited to bleeding, infection, injury to surrounding structures such as the intestine or liver, bile leak, retained gallstones, need to convert to an open procedure, prolonged diarrhea, blood clots such as DVT, common bile duct injury, anesthesia risks, and possible need for additional procedures. The likelihood of improvement in symptoms and return to the patient's normal status is good. We discussed the typical post-operative recovery course.

## 2019-11-06 NOTE — H&P (Signed)
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 time makes better. has been to ER twice now, last was Saturday. no urinary issues, no issues with bms. she had [place] 7/31 that shows mobile echogenic foci consistent with gallstones largest .9cm, cbd normal, tb was 1.8 up from last er visit. she was sent home for follow up in our office. she has some soreness now but no pain. not eating much  Past Surgical History (Chanel Nolan, CMA; 11/03/2019 9:04 AM) Tonsillectomy   Diagnostic Studies History (Chanel Nolan, CMA; 11/03/2019 9:04 AM) Colonoscopy  never Mammogram  never Pap Smear  1-5 years ago  Allergies (Chanel Nolan, CMA; 11/03/2019 9:04 AM) No Known Drug Allergies  [11/03/2019]: Allergies Reconciled   Medication History (Chanel Nolan, CMA; 11/03/2019 9:05 AM) Sertraline HCl (100MG Tablet, Oral) Active. Nortrel 1/35 (21) (1-35MG-MCG Tablet, Oral) Active. Atorvastatin Calcium (20MG Tablet, Oral) Active. Medications Reconciled  Social History (Chanel Nolan, CMA; 11/03/2019 9:04 AM) Alcohol use  Occasional alcohol use. Caffeine use  Carbonated beverages, Coffee, Tea. No drug use  Tobacco use  Never smoker.  Family History (Chanel Nolan, CMA; 11/03/2019 9:04 AM) Arthritis  Family Members In General. Breast Cancer  Family Members In General. Cancer  Family Members In General. Diabetes Mellitus  Family Members In General, Father. Heart Disease  Family Members In General, Father. Heart disease in female family member before age 65  Heart disease in female family member before age 55  Hypertension  Family Members In General, Father. Thyroid problems  Family Members In General.  Pregnancy / Birth History (Chanel Nolan, CMA; 11/03/2019 9:04 AM) Age at menarche  11 years. Contraceptive History  Oral contraceptives. Gravida  0 Irregular periods  Para  0  Other Problems (Chanel Nolan, CMA;  11/03/2019 9:04 AM) Anxiety Disorder  Cholelithiasis  Gastroesophageal Reflux Disease  Hypercholesterolemia   Review of Systems (Chanel Nolan CMA; 11/03/2019 9:04 AM) General Not Present- Appetite Loss, Chills, Fatigue, Fever, Night Sweats, Weight Gain and Weight Loss. Skin Not Present- Change in Wart/Mole, Dryness, Hives, Jaundice, New Lesions, Non-Healing Wounds, Rash and Ulcer. HEENT Not Present- Earache, Hearing Loss, Hoarseness, Nose Bleed, Oral Ulcers, Ringing in the Ears, Seasonal Allergies, Sinus Pain, Sore Throat, Visual Disturbances, Wears glasses/contact lenses and Yellow Eyes. Respiratory Not Present- Bloody sputum, Chronic Cough, Difficulty Breathing, Snoring and Wheezing. Breast Not Present- Breast Mass, Breast Pain, Nipple Discharge and Skin Changes. Cardiovascular Not Present- Chest Pain, Difficulty Breathing Lying Down, Leg Cramps, Palpitations, Rapid Heart Rate, Shortness of Breath and Swelling of Extremities. Gastrointestinal Present- Abdominal Pain, Bloating and Nausea. Not Present- Bloody Stool, Change in Bowel Habits, Chronic diarrhea, Constipation, Difficulty Swallowing, Excessive gas, Gets full quickly at meals, Hemorrhoids, Indigestion, Rectal Pain and Vomiting. Female Genitourinary Not Present- Frequency, Nocturia, Painful Urination, Pelvic Pain and Urgency. Musculoskeletal Not Present- Back Pain, Joint Pain, Joint Stiffness, Muscle Pain, Muscle Weakness and Swelling of Extremities. Neurological Not Present- Decreased Memory, Fainting, Headaches, Numbness, Seizures, Tingling, Tremor, Trouble walking and Weakness. Psychiatric Not Present- Anxiety, Bipolar, Change in Sleep Pattern, Depression, Fearful and Frequent crying. Endocrine Not Present- Cold Intolerance, Excessive Hunger, Hair Changes, Heat Intolerance, Hot flashes and New Diabetes. Hematology Not Present- Blood Thinners, Easy Bruising, Excessive bleeding, Gland problems, HIV and Persistent Infections.  Vitals  (Chanel Nolan CMA; 11/03/2019 9:05 AM) 11/03/2019 9:05 AM Weight: 213.5 lb Height: 63in Body Surface Area: 1.99 m Body Mass Index: 37.82 kg/m  Temp.: 98.1F  Pulse: 105 (  Regular)  BP: 122/80(Sitting, Left Arm, Standard) Physical Exam (Ambur Province MD; 11/03/2019 9:35 AM) General Mental Status-Alert. Orientation-Oriented X3. Eye Sclera/Conjunctiva - Bilateral-No scleral icterus. Abdomen Note: soft nondistended tender ruq to deep palpation, no murphys sign   Assessment & Plan (Keri Veale MD; 11/03/2019 9:36 AM) GALLSTONES (K80.20) Story: Lap chole  I discussed the procedure in detail. We discussed the risks and benefits of a laparoscopic cholecystectomy and possible cholangiogram including, but not limited to bleeding, infection, injury to surrounding structures such as the intestine or liver, bile leak, retained gallstones, need to convert to an open procedure, prolonged diarrhea, blood clots such as DVT, common bile duct injury, anesthesia risks, and possible need for additional procedures. The likelihood of improvement in symptoms and return to the patient's normal status is good. We discussed the typical post-operative recovery course. 

## 2019-11-06 NOTE — Transfer of Care (Signed)
Immediate Anesthesia Transfer of Care Note  Patient: Brittany Cooper  Procedure(s) Performed: LAPAROSCOPIC CHOLECYSTECTOMY WITH INDOCYANINE GREEN DYE (N/A Abdomen)  Patient Location: PACU  Anesthesia Type:General  Level of Consciousness: awake, alert , oriented and patient cooperative  Airway & Oxygen Therapy: Patient Spontanous Breathing and Patient connected to face mask oxygen  Post-op Assessment: Report given to RN and Post -op Vital signs reviewed and stable  Post vital signs: Reviewed and stable  Last Vitals:  Vitals Value Taken Time  BP    Temp    Pulse 88 11/06/19 1625  Resp    SpO2 100 % 11/06/19 1625  Vitals shown include unvalidated device data.  Last Pain:  Vitals:   11/06/19 1345  TempSrc: Oral      Patients Stated Pain Goal: 7 (11/06/19 1347)  Complications: No complications documented.

## 2019-11-06 NOTE — Op Note (Signed)
Preoperative diagnosis: Biliary colic Postoperative diagnosis: Same as above Procedure: Laparoscopic cholecystectomy Surgeon: Dr. Harden Mo Anesthesia: General with tap blocks Specimens: Gallbladder and contents to pathology Complications: None Drains: None Estimated blood loss: Minimal Special count was correct completion Disposition to recovery stable condition  Indications: This is a 24 year old female with weeks of right upper quadrant pain who presented with gallstones and right upper quadrant pain to my office.  We discussed laparoscopic cholecystectomy.  Procedure: After informed consent was obtained the patient was first injected with indocyanine green dye.  She underwent bilateral tap blocks with anesthesia.  She was given antibiotics.  SCDs were in place.  She was then placed under general anesthesia without complication.  She was prepped and draped in the standard sterile surgical fashion.  A surgical timeout was then performed.  Infiltrated Marcaine below the umbilicus and made a vertical incision.  I grasped the fascia and incised this sharply.  I entered the peritoneum bluntly.  This was done without injury.  I placed a 0 Vicryl pursestring suture through the fascia.  I then inserted a Hassan trocar and insufflated the abdomen to 15 mmHg pressure.  I then placed 3 further 5 mm trochars in the epigastrium and right abdomen.  This is done under direct vision without complication.  I then grasped the gallbladder and retracted it cephalad and lateral.  She had a fair amount of scarring in her triangle.  I dissected the triangle of Towanda Malkin and was able to identify the critical view of safety.  I used the green dye to confirm this view.  I was able to see the common bile duct and the cystic duct entering it with this view.  I then clipped the cystic duct 3 times dividing it leaving 2 clips in place.  I did the same thing with the cystic artery.  I then remove the gallbladder from the  liver bed without difficulty.  This was placed in a retrieval bag and removed from the umbilicus.  I then removed my Hassan trocar and tied down my pursestring.  I then irrigated.  Hemostasis was obtained.  I then placed an additional 0 Vicryl suture through the umbilical incision using the suture passer device.  I then desufflated the abdomen and remove the remaining trochars.  These were closed with 4-0 Monocryl and glue.  She tolerated this well was extubated and transferred to recovery stable.

## 2019-11-06 NOTE — Progress Notes (Signed)
Assisted Dr. Armond Hang with right, left, ultrasound guided, transabdominal plane block. Side rails up, monitors on throughout procedure. See vital signs in flow sheet. Tolerated Procedure well.

## 2019-11-07 ENCOUNTER — Encounter (HOSPITAL_BASED_OUTPATIENT_CLINIC_OR_DEPARTMENT_OTHER): Payer: Self-pay | Admitting: General Surgery

## 2019-11-10 LAB — SURGICAL PATHOLOGY

## 2019-11-25 MED FILL — ATORVASTATIN CALCIUM 20 MG: 20 | 30 days supply | Qty: 30 | Fill #2

## 2019-11-25 MED FILL — SERTRALINE HCL 100 MG TABS: 100 | 60 days supply | Qty: 60 | Fill #2

## 2020-01-26 ENCOUNTER — Ambulatory Visit: Payer: 59 | Admitting: Family Medicine

## 2020-01-26 ENCOUNTER — Encounter: Payer: Self-pay | Admitting: Family Medicine

## 2020-01-26 ENCOUNTER — Other Ambulatory Visit: Payer: Self-pay | Admitting: Family Medicine

## 2020-01-26 ENCOUNTER — Other Ambulatory Visit: Payer: Self-pay

## 2020-01-26 VITALS — BP 127/91 | HR 97 | Temp 98.2°F | Resp 16 | Ht 62.5 in | Wt 223.8 lb

## 2020-01-26 DIAGNOSIS — R739 Hyperglycemia, unspecified: Secondary | ICD-10-CM | POA: Diagnosis not present

## 2020-01-26 DIAGNOSIS — K219 Gastro-esophageal reflux disease without esophagitis: Secondary | ICD-10-CM

## 2020-01-26 DIAGNOSIS — K915 Postcholecystectomy syndrome: Secondary | ICD-10-CM | POA: Diagnosis not present

## 2020-01-26 DIAGNOSIS — Z8719 Personal history of other diseases of the digestive system: Secondary | ICD-10-CM | POA: Diagnosis not present

## 2020-01-26 DIAGNOSIS — R7989 Other specified abnormal findings of blood chemistry: Secondary | ICD-10-CM | POA: Diagnosis not present

## 2020-01-26 DIAGNOSIS — R110 Nausea: Secondary | ICD-10-CM | POA: Diagnosis not present

## 2020-01-26 DIAGNOSIS — Z9049 Acquired absence of other specified parts of digestive tract: Secondary | ICD-10-CM

## 2020-01-26 DIAGNOSIS — R142 Eructation: Secondary | ICD-10-CM | POA: Diagnosis not present

## 2020-01-26 MED ORDER — OMEPRAZOLE 40 MG PO CPDR
40.0000 mg | DELAYED_RELEASE_CAPSULE | Freq: Two times a day (BID) | ORAL | 1 refills | Status: DC
Start: 2020-01-26 — End: 2020-01-26

## 2020-01-26 MED ORDER — ATORVASTATIN CALCIUM 20 MG PO TABS: 20.0000 mg | ORAL_TABLET | Freq: Every day | ORAL | 1 refills | Status: DC

## 2020-01-26 MED FILL — OMEPRAZOLE 40 MG CPDR: 40 | 30 days supply | Qty: 60 | Fill #0

## 2020-01-26 MED FILL — ATORVASTATIN CALCIUM 20 MG: 20 | 90 days supply | Qty: 90 | Fill #0

## 2020-01-26 NOTE — Progress Notes (Signed)
OFFICE VISIT  01/26/2020  CC:  Chief Complaint  Patient presents with  . Increased reflux    nausea since last surgery on 11/06/19. Has been using prilosec otc and tums but is not really helping. Most reflux issue occurring at night and using zofran to manage nausea.   HPI:    Patient is a 24 y.o. Caucasian female who presents for f/u hyperlipidemia, plus having increased GER and nausea SINCE having cholecystectomy 11/06/2019 (gallstones and chronic cholecystitis). Feeling "acid reflux", worse at night.  Describes burning sensation in throat, belching, pretty constant, ? worse with spicy/rich foods.  OTC prilosec 20mg  not helpful.  Inc dose to 40mg  qd no help.  No epigastric, substernal, or RUQ pain.  No fevers.  Tums.  Zofran for chronic nausea (since her GB surg), occ throwing up supper.  Bowels moving good, no diarrhea or constipation.  She checked her glucose fasting---186. Hungry all the time.  One glucose check of 63 (prior to supper).   Started atorvastatin after labs 09/2019 showed LDL 189, trigs 165. Tolerated atorva but tx interrupted by having GB surgery---out of this med x 1 mo now.  Wt is up 12 lbs since last visit.  ROS: no fevers, no CP, no SOB, no wheezing, no cough, no dizziness, no HAs, no rashes, no melena/hematochezia.  No polyuria or polydipsia.  No myalgias or arthralgias.  No focal weakness, paresthesias, or tremors.  No acute vision or hearing abnormalities. No palpitations.     Past Medical History:  Diagnosis Date  . Frequent headaches   . GAD (generalized anxiety disorder)   . Gout 11/2016  . Hypercholesterolemia   . Hyperlipemia, mixed    recommended statin 09/2019  . Hypersomnolence 2019   Noct polysomn + multiple sleep latency testing planned as of Nov 2019 neuro eval.  . Migraines   . Obesity, Class II, BMI 35-39.9   . PMDD (premenstrual dysphoric disorder) 11/2016  . Subclinical hypothyroidism 02/2018   + TPO ab mildly elevated-->evolving  hashimoto's.  TSH back to normal 04/23/2018.    Past Surgical History:  Procedure Laterality Date  . CHOLECYSTECTOMY N/A 11/06/2019   Procedure: LAPAROSCOPIC CHOLECYSTECTOMY WITH INDOCYANINE GREEN DYE;  Surgeon: 04/25/2018, MD;  Location: [facility];  Service: General;  Laterality: N/A;  . TONSILLECTOMY     child    Outpatient Medications Prior to Visit  Medication Sig Dispense Refill  . NORTREL 1/35, 21, tablet Take 1 tablet by mouth daily.    . ondansetron (ZOFRAN ODT) 4 MG disintegrating tablet Take 1 tablet (4 mg total) by mouth every 4 (four) hours as needed for nausea or vomiting. 20 tablet 0  . sertraline (ZOLOFT) 100 MG tablet Take 1 tablet (100 mg total) by mouth daily. 90 tablet 3  . atorvastatin (LIPITOR) 20 MG tablet Take 1 tablet (20 mg total) by mouth daily. 30 tablet 2  . omeprazole (PRILOSEC) 20 MG capsule Take 1 capsule (20 mg total) by mouth daily. 30 capsule 0  . HYDROcodone-acetaminophen (NORCO/VICODIN) 5-325 MG tablet Take 1-2 tablets by mouth every 6 (six) hours as needed for moderate pain or severe pain. 20 tablet 0  . oxyCODONE (OXY IR/ROXICODONE) 5 MG immediate release tablet Take 1 tablet (5 mg total) by mouth every 6 (six) hours as needed. 10 tablet 0   No facility-administered medications prior to visit.    No Known Allergies  ROS As per HPI  PE: Vitals with BMI 01/26/2020 11/06/2019 11/06/2019  Height 5' 2.5" - -  Weight 223 lbs 13 oz - -  BMI 40.26 - -  Systolic 127 149 202  Diastolic 91 97 92  Pulse 97 90 91    Gen: Alert, well appearing.  Patient is oriented to person, place, time, and situation. AFFECT: pleasant, lucid thought and speech. CV: RRR, no m/r/g.   LUNGS: CTA bilat, nonlabored resps, good aeration in all lung fields. ABD: soft, NT, ND, BS normal.  No hepatospenomegaly or mass.  No bruits. EXT: no clubbing or cyanosis.  no edema.    LABS:    Chemistry      Component Value Date/Time   NA 133 (L) 11/04/2019  0930   K 4.6 11/04/2019 0930   CL 103 11/04/2019 0930   CO2 21 (L) 11/04/2019 0930   BUN 9 11/04/2019 0930   CREATININE 0.88 11/04/2019 0930      Component Value Date/Time   CALCIUM 9.2 11/04/2019 0930   ALKPHOS 69 11/04/2019 0930   AST 62 (H) 11/04/2019 0930   ALT 102 (H) 11/04/2019 0930   BILITOT 0.5 11/04/2019 0930     Lab Results  Component Value Date   WBC 7.6 11/01/2019   HGB 14.0 11/01/2019   HCT 41.4 11/01/2019   MCV 88.7 11/01/2019   PLT 329 11/01/2019   Lab Results  Component Value Date   CHOL 170 10/26/2019   HDL 63 10/26/2019   LDLCALC 83 10/26/2019   TRIG 122 10/26/2019   CHOLHDL 2.7 10/26/2019   No results found for: HGBA1C  IMPRESSION AND PLAN:  1) Reflux: acid vs non acid. Signif nausea, occ emesis. No abd pain and no fevers.-->all started after having cholecystectomy  ? Post-cholecystectomy syndrome?   Plan: increase omeprazole to 40mg  bid-rx today. Continue zofran 8 mg q8h prn. Check CBC, CMET, lipase. No imaging indicated at this time. Refer to GI.  2) Hyperglycemia, polyphagia. No past probs with glucoses. Check glucose today and Hba1c today.  3) HLD: tolerated statin. Off this med x 1 mo after running out. Will restart this med but wait until after GI eval.  An After Visit Summary was printed and given to the patient.  FOLLOW UP: Return in about 6 weeks (around 03/08/2020) for f/u GI sx's and hyperlipidemia.  Signed:  14/09/2019, MD           01/26/2020

## 2020-01-27 ENCOUNTER — Other Ambulatory Visit: Payer: Self-pay | Admitting: Family Medicine

## 2020-01-27 LAB — CBC WITH DIFFERENTIAL/PLATELET
Basophils Absolute: 0.1 10*3/uL (ref 0.0–0.1)
Basophils Relative: 0.6 % (ref 0.0–3.0)
Eosinophils Absolute: 0.1 10*3/uL (ref 0.0–0.7)
Eosinophils Relative: 1.3 % (ref 0.0–5.0)
HCT: 41.2 % (ref 36.0–46.0)
Hemoglobin: 13.8 g/dL (ref 12.0–15.0)
Lymphocytes Relative: 20 % (ref 12.0–46.0)
Lymphs Abs: 2.2 10*3/uL (ref 0.7–4.0)
MCHC: 33.5 g/dL (ref 30.0–36.0)
MCV: 89.2 fl (ref 78.0–100.0)
Monocytes Absolute: 0.8 10*3/uL (ref 0.1–1.0)
Monocytes Relative: 7.1 % (ref 3.0–12.0)
Neutro Abs: 7.8 10*3/uL — ABNORMAL HIGH (ref 1.4–7.7)
Neutrophils Relative %: 71 % (ref 43.0–77.0)
Platelets: 357 10*3/uL (ref 150.0–400.0)
RBC: 4.62 Mil/uL (ref 3.87–5.11)
RDW: 13.9 % (ref 11.5–15.5)
WBC: 11.1 10*3/uL — ABNORMAL HIGH (ref 4.0–10.5)

## 2020-01-27 LAB — COMPREHENSIVE METABOLIC PANEL
ALT: 44 U/L — ABNORMAL HIGH (ref 0–35)
AST: 44 U/L — ABNORMAL HIGH (ref 0–37)
Albumin: 4 g/dL (ref 3.5–5.2)
Alkaline Phosphatase: 61 U/L (ref 39–117)
BUN: 14 mg/dL (ref 6–23)
CO2: 24 mEq/L (ref 19–32)
Calcium: 9.4 mg/dL (ref 8.4–10.5)
Chloride: 100 mEq/L (ref 96–112)
Creatinine, Ser: 0.85 mg/dL (ref 0.40–1.20)
GFR: 94.89 mL/min (ref >60.00)
Glucose, Bld: 114 mg/dL — ABNORMAL HIGH (ref 70–99)
Potassium: 4.1 mEq/L (ref 3.5–5.1)
Sodium: 134 mEq/L — ABNORMAL LOW (ref 135–145)
Total Bilirubin: 0.3 mg/dL (ref 0.2–1.2)
Total Protein: 7.2 g/dL (ref 6.0–8.3)

## 2020-01-27 LAB — LIPASE: Lipase: 47 U/L (ref 11.0–59.0)

## 2020-01-27 LAB — HEMOGLOBIN A1C: Hgb A1c MFr Bld: 6.1 % (ref 4.6–6.5)

## 2020-01-27 MED FILL — SERTRALINE HCL 100 MG TABS: 100 | 90 days supply | Qty: 90 | Fill #0

## 2020-01-28 ENCOUNTER — Other Ambulatory Visit: Payer: 59

## 2020-01-28 DIAGNOSIS — R7401 Elevation of levels of liver transaminase levels: Secondary | ICD-10-CM | POA: Diagnosis not present

## 2020-01-29 LAB — HEPATITIS C ANTIBODY
Hepatitis C Ab: NONREACTIVE
SIGNAL TO CUT-OFF: 0.01 (ref <1.00)

## 2020-01-29 LAB — HEPATITIS B SURFACE ANTIBODY,QUALITATIVE: Hep B S Ab: REACTIVE — AB

## 2020-01-30 ENCOUNTER — Encounter: Payer: Self-pay | Admitting: Family Medicine

## 2020-03-08 ENCOUNTER — Other Ambulatory Visit (HOSPITAL_COMMUNITY): Payer: Self-pay | Admitting: Obstetrics & Gynecology

## 2020-03-08 MED FILL — ALYACEN 1-35-28 TABLET: 1-35 | 84 days supply | Qty: 112 | Fill #0

## 2020-03-10 NOTE — Progress Notes (Signed)
Spoke with Olegario Messier at [facility] and they will try to contact her. They have tried to contact her and she did not call back to schedule.

## 2020-04-27 MED FILL — SERTRALINE HCL 100 MG TABS: 100 | 90 days supply | Qty: 90 | Fill #1

## 2020-05-05 MED FILL — ATORVASTATIN CALCIUM 20 MG: 20 | 90 days supply | Qty: 90 | Fill #1

## 2020-05-07 ENCOUNTER — Other Ambulatory Visit (HOSPITAL_COMMUNITY): Payer: Self-pay

## 2020-05-14 MED FILL — ALYACEN 1-35-28 TABLET: 1-35 | 84 days supply | Qty: 112 | Fill #1

## 2020-05-31 ENCOUNTER — Encounter: Payer: Self-pay | Admitting: Family Medicine

## 2020-06-01 ENCOUNTER — Other Ambulatory Visit: Payer: Self-pay | Admitting: Family Medicine

## 2020-06-01 MED ORDER — ONDANSETRON 4 MG PO TBDP: ORAL_TABLET | ORAL | 1 refills | Status: DC

## 2020-06-01 MED FILL — ONDANSETRON ODT 4 MG TABLET: 4 | 5 days supply | Qty: 30 | Fill #0

## 2020-06-01 MED FILL — OMEPRAZOLE 40 MG CPDR: 40 | 30 days supply | Qty: 60 | Fill #1

## 2020-06-01 NOTE — Telephone Encounter (Signed)
Pt was last seen 01/26/20, advised to f/u 6 weeks. Mentioned in note: Plan: increase omeprazole to 40mg  bid-rx today. Continue zofran 8 mg q8h prn. Check CBC, CMET, lipase. No imaging indicated at this time. Refer to GI  Please advise if refill appropriate, thanks.

## 2020-06-01 NOTE — Telephone Encounter (Signed)
Zofran eRx'd 

## 2020-06-24 ENCOUNTER — Other Ambulatory Visit (HOSPITAL_BASED_OUTPATIENT_CLINIC_OR_DEPARTMENT_OTHER): Payer: Self-pay

## 2020-07-21 ENCOUNTER — Other Ambulatory Visit (HOSPITAL_COMMUNITY): Payer: Self-pay

## 2020-07-21 ENCOUNTER — Other Ambulatory Visit: Payer: Self-pay | Admitting: Family Medicine

## 2020-07-21 MED ORDER — ATORVASTATIN CALCIUM 20 MG PO TABS
ORAL_TABLET | Freq: Every day | ORAL | 0 refills | Status: DC
  Filled 2020-07-21: qty 14, 14d supply, fill #0

## 2020-07-21 MED ORDER — SERTRALINE HCL 100 MG PO TABS
ORAL_TABLET | Freq: Every day | ORAL | 0 refills | Status: DC
  Filled 2020-07-21: qty 14, 14d supply, fill #0

## 2020-07-21 MED ORDER — OMEPRAZOLE 40 MG PO CPDR
DELAYED_RELEASE_CAPSULE | ORAL | 0 refills | Status: DC
  Filled 2020-07-21: qty 28, 14d supply, fill #0

## 2020-07-22 ENCOUNTER — Other Ambulatory Visit (HOSPITAL_COMMUNITY): Payer: Self-pay

## 2020-08-02 ENCOUNTER — Encounter: Payer: Self-pay | Admitting: Family Medicine

## 2020-08-02 ENCOUNTER — Other Ambulatory Visit: Payer: Self-pay

## 2020-08-02 ENCOUNTER — Ambulatory Visit: Payer: 59 | Admitting: Family Medicine

## 2020-08-02 ENCOUNTER — Other Ambulatory Visit (HOSPITAL_COMMUNITY): Payer: Self-pay

## 2020-08-02 VITALS — BP 124/78 | HR 92 | Temp 98.3°F | Resp 16 | Ht 62.5 in | Wt 236.2 lb

## 2020-08-02 DIAGNOSIS — R7989 Other specified abnormal findings of blood chemistry: Secondary | ICD-10-CM

## 2020-08-02 DIAGNOSIS — E7800 Pure hypercholesterolemia, unspecified: Secondary | ICD-10-CM

## 2020-08-02 DIAGNOSIS — K915 Postcholecystectomy syndrome: Secondary | ICD-10-CM

## 2020-08-02 DIAGNOSIS — K529 Noninfective gastroenteritis and colitis, unspecified: Secondary | ICD-10-CM | POA: Diagnosis not present

## 2020-08-02 DIAGNOSIS — K219 Gastro-esophageal reflux disease without esophagitis: Secondary | ICD-10-CM | POA: Diagnosis not present

## 2020-08-02 DIAGNOSIS — R7401 Elevation of levels of liver transaminase levels: Secondary | ICD-10-CM

## 2020-08-02 DIAGNOSIS — R7303 Prediabetes: Secondary | ICD-10-CM | POA: Diagnosis not present

## 2020-08-02 DIAGNOSIS — F411 Generalized anxiety disorder: Secondary | ICD-10-CM | POA: Diagnosis not present

## 2020-08-02 MED ORDER — OMEPRAZOLE 40 MG PO CPDR
DELAYED_RELEASE_CAPSULE | ORAL | 11 refills | Status: DC
  Filled 2020-08-02: qty 60, 30d supply, fill #0
  Filled 2020-09-13: qty 60, 30d supply, fill #1
  Filled 2020-11-03: qty 60, 30d supply, fill #2
  Filled 2021-01-06: qty 60, 30d supply, fill #3
  Filled 2021-03-07: qty 60, 30d supply, fill #4
  Filled 2021-04-20: qty 60, 30d supply, fill #5
  Filled 2021-05-23: qty 60, 30d supply, fill #6

## 2020-08-02 MED ORDER — SERTRALINE HCL 100 MG PO TABS
ORAL_TABLET | Freq: Every day | ORAL | 3 refills | Status: DC
  Filled 2020-08-02: qty 90, 90d supply, fill #0
  Filled 2020-11-03: qty 90, 90d supply, fill #1
  Filled 2021-01-27: qty 90, 90d supply, fill #2
  Filled 2021-04-20: qty 90, 90d supply, fill #3

## 2020-08-02 MED ORDER — ATORVASTATIN CALCIUM 20 MG PO TABS
ORAL_TABLET | Freq: Every day | ORAL | 0 refills | Status: DC
  Filled 2020-08-02: qty 14, 14d supply, fill #0

## 2020-08-02 NOTE — Addendum Note (Signed)
Addended by: Paschal Dopp on: 08/02/2020 03:44 PM   Modules accepted: Orders

## 2020-08-02 NOTE — Progress Notes (Signed)
OFFICE VISIT  08/02/2020  CC:  Chief Complaint  Patient presents with  . Follow-up    HLD, elevated LFT. Not fasting     HPI:    Patient is a 25 y.o. Caucasian female who presents for 6 mo f/u HLD, prediabetes, and elevated LFTs. A/P as of last visit: "1) Reflux: acid vs non acid. Signif nausea, occ emesis. No abd pain and no fevers.-->all started after having cholecystectomy  ? Post-cholecystectomy syndrome?   Plan: increase omeprazole to 40mg  bid-rx today. Continue zofran 8 mg q8h prn. Check CBC, CMET, lipase. No imaging indicated at this time. Refer to GI.  2) Hyperglycemia, polyphagia. No past probs with glucoses. Check glucose today and Hba1c today.  3) HLD: tolerated statin. Off this med x 1 mo after running out. Will restart this med but wait until after GI eval."  INTERIM HX: Her upper/substernal burning and belching all got signif better when she went to bid omeprazole, so she never set up appt with GI.  If she forgets her 2nd dose for a couple days she says she has not experienced bothersome breakthrough sx's lately.  Since GB surgery has postprandial loose BM, "oily" looking--no matter what type of food she eats.  No abd pain.   Appetite is normal.  No otc or herbal med use.  Works from home, no exercise. She is frustrated b/c continues to gain wt despite limiting calories.  HLD: LDL was 83 July 2021 while on statin.  Tolerating med fine.  Elev LFTs:  Mild->AST and ALT both 44 at last visit.  Prediab: Hba1c 6.1 on labs last visit 6 mo ago, says she eats a good diet but admits to no exercise, wt has gone up 12 lb since last visit.  ROS as above, plus--> no fevers, no CP, no SOB, no wheezing, no cough, no dizziness, no HAs, no rashes, no melena/hematochezia.  No polyuria or polydipsia.  No myalgias or arthralgias.  No focal weakness, paresthesias, or tremors.  No acute vision or hearing abnormalities.  No dysuria or unusual/new urinary urgency or frequency.   No recent changes in lower legs. No n/v/d or abd pain.  No palpitations.     Past Medical History:  Diagnosis Date  . Frequent headaches   . GAD (generalized anxiety disorder)   . Gout 11/2016  . History of cholecystitis 11/2019   with gallstones; cholecystectomy 11/06/19  . Hypercholesterolemia   . Hyperlipemia, mixed    recommended statin 09/2019  . Hypersomnolence 2019   Noct polysomn + multiple sleep latency testing planned as of Nov 2019 neuro eval.  . Migraines   . Obesity, Class II, BMI 35-39.9   . PMDD (premenstrual dysphoric disorder) 11/2016  . Subclinical hypothyroidism 02/2018   + TPO ab mildly elevated-->evolving hashimoto's.  TSH back to normal 04/23/2018.    Past Surgical History:  Procedure Laterality Date  . CHOLECYSTECTOMY N/A 11/06/2019   Procedure: LAPAROSCOPIC CHOLECYSTECTOMY WITH INDOCYANINE GREEN DYE;  Surgeon: 01/06/2020, MD;  Location: [facility];  Service: General;  Laterality: N/A;  . TONSILLECTOMY     child    Outpatient Medications Prior to Visit  Medication Sig Dispense Refill  . norethindrone-ethinyl estradiol 1/35 (ORTHO-NOVUM) tablet TAKE 1 TABLET BY MOUTH ONCE A DAY ACTIVE PILLS ONLY 84 tablet 0  . ondansetron (ZOFRAN-ODT) 4 MG disintegrating tablet DISSOLVE 1-2 TABLETS BY MOUTH 3 TIMES DAILY AS NEEDED FOR NAUSEA 30 tablet 1  . atorvastatin (LIPITOR) 20 MG tablet TAKE 1 TABLET BY MOUTH  DAILY. 14 tablet 0  . omeprazole (PRILOSEC) 40 MG capsule TAKE 1 CAPSULE (40 MG TOTAL) BY MOUTH IN THE MORNING AND 1 AT BEDTIME. 28 capsule 0  . sertraline (ZOLOFT) 100 MG tablet TAKE 1 TABLET BY MOUTH DAILY 14 tablet 0  . norethindrone-ethinyl estradiol 1/35 (ORTHO-NOVUM) tablet TAKE 1 TABLET BY MOUTH EVERY DAY CONTINUOUS USE 112 tablet 1  . NORTREL 1/35, 21, tablet Take 1 tablet by mouth daily.     No facility-administered medications prior to visit.    No Known Allergies  ROS As per HPI  PE: Vitals with BMI 08/02/2020 01/26/2020  11/06/2019  Height 5' 2.5" 5' 2.5" -  Weight 236 lbs 3 oz 223 lbs 13 oz -  BMI 42.49 40.26 -  Systolic 124 127 185  Diastolic 78 91 97  Pulse 92 97 90     Exam chaperoned by Emi Holes, CMA.  Gen: Alert, well appearing.  Patient is oriented to person, place, time, and situation. AFFECT: pleasant, lucid thought and speech. CV: RRR, no m/r/g.   LUNGS: CTA bilat, nonlabored resps, good aeration in all lung fields. ABD: soft, NT, ND, BS normal.  No hepatospenomegaly or mass.  No bruits.    LABS:  Lab Results  Component Value Date   TSH 5.66 (H) 09/29/2019   Lab Results  Component Value Date   WBC 11.1 (H) 01/26/2020   HGB 13.8 01/26/2020   HCT 41.2 01/26/2020   MCV 89.2 01/26/2020   PLT 357.0 01/26/2020   Lab Results  Component Value Date   CREATININE 0.85 01/26/2020   BUN 14 01/26/2020   NA 134 (L) 01/26/2020   K 4.1 01/26/2020   CL 100 01/26/2020   CO2 24 01/26/2020   Lab Results  Component Value Date   ALT 44 (H) 01/26/2020   AST 44 (H) 01/26/2020   ALKPHOS 61 01/26/2020   BILITOT 0.3 01/26/2020   Lab Results  Component Value Date   CHOL 170 10/26/2019   Lab Results  Component Value Date   HDL 63 10/26/2019   Lab Results  Component Value Date   LDLCALC 83 10/26/2019   Lab Results  Component Value Date   TRIG 122 10/26/2019   Lab Results  Component Value Date   CHOLHDL 2.7 10/26/2019   Lab Results  Component Value Date   HGBA1C 6.1 01/26/2020   IMPRESSION AND PLAN:  1) GERD with ? Component of biliary reflux-->started after getting cholecystectomy in summer 2021.  Much improved with bid omeprazole. Try cutting back to qd omeprazole 40mg .  2) Postprandial diarrhea: also started after getting cholecystectomy summer 2021. Discussed possible trial of cholestyramine but she said she can put up with this since she works from home and it doesn't impair her lifestyle significantly.  3) HLD: tolerating Atorva 20mg  qd, LDL was good on this dose  back in July 2021. She is NOT fasting today but wants to go ahead and check lipid panel today.  4) Prediabetes: keep working on diet.  Needs to start exercise. Non-fasting glucose + Hba1c ordered today.  5) GAD: stable on sertraline 100 mg qd, continue this.  6) Subclinical hypothyroidism: monitor TSH, free T4, and T3 total today.  An After Visit Summary was printed and given to the patient.  FOLLOW UP: Return in about 6 months (around 02/02/2021) for annual CPE (fasting).  Signed:  August 2021, MD           08/02/2020

## 2020-08-03 LAB — COMPREHENSIVE METABOLIC PANEL
ALT: 30 U/L (ref 0–35)
AST: 30 U/L (ref 0–37)
Albumin: 4 g/dL (ref 3.5–5.2)
Alkaline Phosphatase: 68 U/L (ref 39–117)
BUN: 11 mg/dL (ref 6–23)
CO2: 21 mEq/L (ref 19–32)
Calcium: 9.5 mg/dL (ref 8.4–10.5)
Chloride: 102 mEq/L (ref 96–112)
Creatinine, Ser: 0.84 mg/dL (ref 0.40–1.20)
GFR: 95.9 mL/min (ref >60.00)
Glucose, Bld: 75 mg/dL (ref 70–99)
Potassium: 4.4 mEq/L (ref 3.5–5.1)
Sodium: 136 mEq/L (ref 135–145)
Total Bilirubin: 0.5 mg/dL (ref 0.2–1.2)
Total Protein: 7.5 g/dL (ref 6.0–8.3)

## 2020-08-03 LAB — LIPID PANEL
Cholesterol: 201 mg/dL — ABNORMAL HIGH (ref 0–200)
HDL: 61.4 mg/dL (ref >39.00)
NonHDL: 140.04
Total CHOL/HDL Ratio: 3
Triglycerides: 232 mg/dL — ABNORMAL HIGH (ref 0.0–149.0)
VLDL: 46.4 mg/dL — ABNORMAL HIGH (ref 0.0–40.0)

## 2020-08-03 LAB — HEMOGLOBIN A1C
Hgb A1c MFr Bld: 5.8 % of total Hgb — ABNORMAL HIGH (ref <5.7)
Mean Plasma Glucose: 120 mg/dL
eAG (mmol/L): 6.6 mmol/L

## 2020-08-03 LAB — T3: T3, Total: 149 ng/dL (ref 76–181)

## 2020-08-03 LAB — LDL CHOLESTEROL, DIRECT: Direct LDL: 117 mg/dL

## 2020-08-03 LAB — TSH: TSH: 6.19 u[IU]/mL — ABNORMAL HIGH (ref 0.35–4.50)

## 2020-08-03 LAB — T4, FREE: Free T4: 0.63 ng/dL (ref 0.60–1.60)

## 2020-08-04 ENCOUNTER — Encounter: Payer: Self-pay | Admitting: Family Medicine

## 2020-08-11 ENCOUNTER — Other Ambulatory Visit (HOSPITAL_COMMUNITY): Payer: Self-pay

## 2020-08-11 ENCOUNTER — Encounter: Payer: Self-pay | Admitting: Family Medicine

## 2020-08-11 ENCOUNTER — Other Ambulatory Visit: Payer: Self-pay | Admitting: Family Medicine

## 2020-08-12 ENCOUNTER — Other Ambulatory Visit (HOSPITAL_COMMUNITY): Payer: Self-pay

## 2020-08-12 ENCOUNTER — Other Ambulatory Visit: Payer: Self-pay

## 2020-08-12 MED ORDER — ATORVASTATIN CALCIUM 20 MG PO TABS
ORAL_TABLET | Freq: Every day | ORAL | 1 refills | Status: DC
  Filled 2020-08-12: qty 90, 90d supply, fill #0
  Filled 2020-11-08: qty 90, 90d supply, fill #1

## 2020-08-13 ENCOUNTER — Other Ambulatory Visit (HOSPITAL_COMMUNITY): Payer: Self-pay

## 2020-08-15 MED FILL — Norethindrone & Ethinyl Estradiol Tab 1 MG-35 MCG: ORAL | 63 days supply | Qty: 84 | Fill #0 | Status: AC

## 2020-08-16 ENCOUNTER — Other Ambulatory Visit (HOSPITAL_COMMUNITY): Payer: Self-pay

## 2020-08-17 ENCOUNTER — Other Ambulatory Visit (HOSPITAL_COMMUNITY): Payer: Self-pay

## 2020-08-17 MED ORDER — NORETHINDRONE-ETH ESTRADIOL 1-35 MG-MCG PO TABS
ORAL_TABLET | ORAL | 0 refills | Status: DC
  Filled 2020-08-17 – 2021-05-12 (×2): qty 84, 63d supply, fill #0

## 2020-09-13 ENCOUNTER — Other Ambulatory Visit (HOSPITAL_COMMUNITY): Payer: Self-pay

## 2020-09-13 MED FILL — Ondansetron Orally Disintegrating Tab 4 MG: ORAL | 5 days supply | Qty: 30 | Fill #0 | Status: AC

## 2020-11-03 ENCOUNTER — Other Ambulatory Visit: Payer: Self-pay | Admitting: Family Medicine

## 2020-11-04 ENCOUNTER — Other Ambulatory Visit (HOSPITAL_COMMUNITY): Payer: Self-pay

## 2020-11-04 MED ORDER — ONDANSETRON 4 MG PO TBDP
ORAL_TABLET | ORAL | 1 refills | Status: DC
  Filled 2020-11-04: qty 30, 5d supply, fill #0
  Filled 2021-04-20: qty 30, 5d supply, fill #1

## 2020-11-04 NOTE — Telephone Encounter (Signed)
RF request for Zofran LOV: 08/02/20 Next ov: advised to f/u Nov Last written:06/01/20(30,0)   Please Advise. Med pending

## 2020-11-05 ENCOUNTER — Other Ambulatory Visit (HOSPITAL_COMMUNITY): Payer: Self-pay

## 2020-11-09 ENCOUNTER — Other Ambulatory Visit (HOSPITAL_COMMUNITY): Payer: Self-pay

## 2021-01-07 ENCOUNTER — Other Ambulatory Visit (HOSPITAL_COMMUNITY): Payer: Self-pay

## 2021-01-24 ENCOUNTER — Ambulatory Visit: Payer: 59

## 2021-01-27 ENCOUNTER — Other Ambulatory Visit (HOSPITAL_COMMUNITY): Payer: Self-pay

## 2021-02-18 ENCOUNTER — Encounter: Payer: Self-pay | Admitting: Family Medicine

## 2021-02-21 ENCOUNTER — Other Ambulatory Visit (HOSPITAL_COMMUNITY): Payer: Self-pay

## 2021-02-21 ENCOUNTER — Other Ambulatory Visit: Payer: Self-pay | Admitting: Family Medicine

## 2021-02-21 MED ORDER — ATORVASTATIN CALCIUM 20 MG PO TABS
ORAL_TABLET | Freq: Every day | ORAL | 0 refills | Status: DC
  Filled 2021-02-21: qty 30, 30d supply, fill #0

## 2021-03-07 ENCOUNTER — Other Ambulatory Visit (HOSPITAL_COMMUNITY): Payer: Self-pay

## 2021-03-09 ENCOUNTER — Other Ambulatory Visit (HOSPITAL_COMMUNITY): Payer: Self-pay

## 2021-03-09 ENCOUNTER — Ambulatory Visit (INDEPENDENT_AMBULATORY_CARE_PROVIDER_SITE_OTHER): Payer: 59 | Admitting: Family Medicine

## 2021-03-09 ENCOUNTER — Encounter: Payer: Self-pay | Admitting: Family Medicine

## 2021-03-09 ENCOUNTER — Other Ambulatory Visit: Payer: Self-pay

## 2021-03-09 VITALS — BP 123/80 | HR 97 | Temp 98.3°F | Ht 63.0 in | Wt 223.6 lb

## 2021-03-09 DIAGNOSIS — E7800 Pure hypercholesterolemia, unspecified: Secondary | ICD-10-CM

## 2021-03-09 DIAGNOSIS — Z0000 Encounter for general adult medical examination without abnormal findings: Secondary | ICD-10-CM

## 2021-03-09 DIAGNOSIS — F411 Generalized anxiety disorder: Secondary | ICD-10-CM

## 2021-03-09 DIAGNOSIS — R7303 Prediabetes: Secondary | ICD-10-CM | POA: Diagnosis not present

## 2021-03-09 DIAGNOSIS — Z713 Dietary counseling and surveillance: Secondary | ICD-10-CM

## 2021-03-09 DIAGNOSIS — E038 Other specified hypothyroidism: Secondary | ICD-10-CM | POA: Diagnosis not present

## 2021-03-09 LAB — CBC WITH DIFFERENTIAL/PLATELET
Basophils Absolute: 0 10*3/uL (ref 0.0–0.1)
Basophils Relative: 0.4 % (ref 0.0–3.0)
Eosinophils Absolute: 0.1 10*3/uL (ref 0.0–0.7)
Eosinophils Relative: 0.9 % (ref 0.0–5.0)
HCT: 41.2 % (ref 36.0–46.0)
Hemoglobin: 13.8 g/dL (ref 12.0–15.0)
Lymphocytes Relative: 22.7 % (ref 12.0–46.0)
Lymphs Abs: 2.4 10*3/uL (ref 0.7–4.0)
MCHC: 33.6 g/dL (ref 30.0–36.0)
MCV: 87 fl (ref 78.0–100.0)
Monocytes Absolute: 0.5 10*3/uL (ref 0.1–1.0)
Monocytes Relative: 5.2 % (ref 3.0–12.0)
Neutro Abs: 7.4 10*3/uL (ref 1.4–7.7)
Neutrophils Relative %: 70.8 % (ref 43.0–77.0)
Platelets: 381 10*3/uL (ref 150.0–400.0)
RBC: 4.74 Mil/uL (ref 3.87–5.11)
RDW: 13.6 % (ref 11.5–15.5)
WBC: 10.5 10*3/uL (ref 4.0–10.5)

## 2021-03-09 LAB — LIPID PANEL
Cholesterol: 250 mg/dL — ABNORMAL HIGH (ref 0–200)
HDL: 65.8 mg/dL (ref >39.00)
LDL Cholesterol: 153 mg/dL — ABNORMAL HIGH (ref 0–99)
NonHDL: 184.29
Total CHOL/HDL Ratio: 4
Triglycerides: 155 mg/dL — ABNORMAL HIGH (ref 0.0–149.0)
VLDL: 31 mg/dL (ref 0.0–40.0)

## 2021-03-09 LAB — COMPREHENSIVE METABOLIC PANEL
ALT: 32 U/L (ref 0–35)
AST: 28 U/L (ref 0–37)
Albumin: 4.3 g/dL (ref 3.5–5.2)
Alkaline Phosphatase: 72 U/L (ref 39–117)
BUN: 13 mg/dL (ref 6–23)
CO2: 18 mEq/L — ABNORMAL LOW (ref 19–32)
Calcium: 10 mg/dL (ref 8.4–10.5)
Chloride: 103 mEq/L (ref 96–112)
Creatinine, Ser: 0.95 mg/dL (ref 0.40–1.20)
GFR: 82.39 mL/min (ref >60.00)
Glucose, Bld: 74 mg/dL (ref 70–99)
Potassium: 4.3 mEq/L (ref 3.5–5.1)
Sodium: 136 mEq/L (ref 135–145)
Total Bilirubin: 0.5 mg/dL (ref 0.2–1.2)
Total Protein: 8.4 g/dL — ABNORMAL HIGH (ref 6.0–8.3)

## 2021-03-09 LAB — TSH: TSH: 6.83 u[IU]/mL — ABNORMAL HIGH (ref 0.35–5.50)

## 2021-03-09 LAB — T3: T3, Total: 142 ng/dL (ref 76–181)

## 2021-03-09 LAB — T4, FREE: Free T4: 0.67 ng/dL (ref 0.60–1.60)

## 2021-03-09 LAB — HEMOGLOBIN A1C: Hgb A1c MFr Bld: 6 % (ref 4.6–6.5)

## 2021-03-09 MED ORDER — OZEMPIC (0.25 OR 0.5 MG/DOSE) 2 MG/1.5ML ~~LOC~~ SOPN
PEN_INJECTOR | SUBCUTANEOUS | 0 refills | Status: DC
  Filled 2021-03-09: qty 1.5, 56d supply, fill #0

## 2021-03-09 MED ORDER — ATORVASTATIN CALCIUM 20 MG PO TABS
ORAL_TABLET | Freq: Every day | ORAL | 3 refills | Status: DC
  Filled 2021-03-09: qty 90, fill #0

## 2021-03-09 NOTE — Patient Instructions (Signed)

## 2021-03-09 NOTE — Progress Notes (Signed)
Office Note 03/09/2021  CC:  Chief Complaint  Patient presents with   Annual Exam    Pt is fasting. Dr.Wein is GYN, last pap 2019.     HPI:  Patient is a 25 y.o. female who is here for annual health maintenance exam and f/u hyperlipidemia, prediab, sublinc hypothyr, and GAD.  Talulah feels well.  She continues to work from home for [employer]. Still frustrated with her diet struggles.  She is on weight watchers and sticks to the types of food she needs to eat but admits she simply gets hungry 1 to 2 hours after eating and ends up eating too much throughout her day.  She does daily moderate intensity exercise in the form of fast walking.  She reports everything is stable from the standpoint of her mood and anxiety levels.  Past Medical History:  Diagnosis Date   Frequent headaches    GAD (generalized anxiety disorder)    GERD (gastroesophageal reflux disease)    Gout 11/2016   History of cholecystitis 11/2019   with gallstones; cholecystectomy 11/06/19   Hyperlipemia, mixed    recommended statin 09/2019   Hypersomnolence 2019   Noct polysomn + multiple sleep latency testing planned as of Nov 2019 neuro eval.   Migraines    Obesity, Class II, BMI 35-39.9    PMDD (premenstrual dysphoric disorder) 11/2016   Postcholecystectomy syndrome    Prediabetes    Subclinical hypothyroidism 02/2018   + TPO ab mildly elevated-->evolving hashimoto's.  TSH back to normal 04/23/2018.    Past Surgical History:  Procedure Laterality Date   CHOLECYSTECTOMY N/A 11/06/2019   Procedure: LAPAROSCOPIC CHOLECYSTECTOMY WITH INDOCYANINE GREEN DYE;  Surgeon: Rolm Bookbinder, MD;  Location: [facility];  Service: General;  Laterality: N/A;   TONSILLECTOMY     child    Family History  Problem Relation Age of Onset   Hyperlipidemia Father    Heart disease Father    Hypertension Father    Diabetes Father    Heart attack Father 32   Arthritis Maternal Grandmother    Breast cancer Maternal  Grandmother    Hyperlipidemia Maternal Grandmother    Heart disease Maternal Grandmother    Hypertension Maternal Grandmother    Diabetes Maternal Grandmother    Multiple myeloma Maternal Grandfather    Arthritis Paternal Grandmother    Liver disease Paternal Grandfather    Alcohol abuse Paternal Grandfather     Social History   Socioeconomic History   Marital status: Single    Spouse name: Not on file   Number of children: Not on file   Years of education: Not on file   Highest education level: Not on file  Occupational History   Not on file  Tobacco Use   Smoking status: Never   Smokeless tobacco: Never  Vaping Use   Vaping Use: Never used  Substance and Sexual Activity   Alcohol use: No   Drug use: No   Sexual activity: Not on file  Other Topics Concern   Not on file  Social History Narrative   Divorced 2020, no children.   Educ: BA at [employer].   Working for [employer] as of 09/2019.   No tob.   Rare alc.   No drugs.   Social Determinants of Health   Financial Resource Strain: Not on file  Food Insecurity: Not on file  Transportation Needs: Not on file  Physical Activity: Not on file  Stress: Not on file  Social Connections: Not on file  Intimate Partner Violence: Not on file    Outpatient Medications Prior to Visit  Medication Sig Dispense Refill   norethindrone-ethinyl estradiol 1/35 (ALAYCEN 1/35) tablet Take 1 tablet daily continously, taking active pills only 84 tablet 0   omeprazole (PRILOSEC) 40 MG capsule TAKE 1 CAPSULE (40 MG TOTAL) BY MOUTH IN THE MORNING AND 1 AT BEDTIME. 60 capsule 11   sertraline (ZOLOFT) 100 MG tablet TAKE 1 TABLET BY MOUTH DAILY 90 tablet 3   atorvastatin (LIPITOR) 20 MG tablet TAKE 1 TABLET BY MOUTH DAILY. 30 tablet 0   ondansetron (ZOFRAN-ODT) 4 MG disintegrating tablet DISSOLVE 1 - 2 TABLETS BY MOUTH 3 TIMES DAILY AS NEEDED FOR NAUSEA (Patient not taking: Reported on 03/09/2021) 30 tablet 1   No facility-administered medications  prior to visit.    No Known Allergies  ROS Review of Systems  Constitutional:  Negative for appetite change, chills, fatigue and fever.  HENT:  Negative for congestion, dental problem, ear pain and sore throat.   Eyes:  Negative for discharge, redness and visual disturbance.  Respiratory:  Negative for cough, chest tightness, shortness of breath and wheezing.   Cardiovascular:  Negative for chest pain, palpitations and leg swelling.  Gastrointestinal:  Negative for abdominal pain, blood in stool, diarrhea, nausea and vomiting.  Genitourinary:  Negative for difficulty urinating, dysuria, flank pain, frequency, hematuria and urgency.  Musculoskeletal:  Negative for arthralgias, back pain, joint swelling, myalgias and neck stiffness.  Skin:  Negative for pallor and rash.  Neurological:  Negative for dizziness, speech difficulty, weakness and headaches.  Hematological:  Negative for adenopathy. Does not bruise/bleed easily.  Psychiatric/Behavioral:  Negative for confusion and sleep disturbance. The patient is not nervous/anxious.    PE; Vitals with BMI 03/09/2021 08/02/2020 01/26/2020  Height 5' 3"  5' 2.5" 5' 2.5"  Weight 223 lbs 10 oz 236 lbs 3 oz 223 lbs 13 oz  BMI 39.62 62.13 08.65  Systolic 784 696 295  Diastolic 80 78 91  Pulse 97 92 97   Exam chaperoned by Deveron Furlong, CMA. Gen: Alert, well appearing.  Patient is oriented to person, place, time, and situation. AFFECT: pleasant, lucid thought and speech. ENT: Ears: EACs clear, normal epithelium.  TMs with good light reflex and landmarks bilaterally.  Eyes: no injection, icteris, swelling, or exudate.  EOMI, PERRLA. Nose: no drainage or turbinate edema/swelling.  No injection or focal lesion.  Mouth: lips without lesion/swelling.  Oral mucosa pink and moist.  Dentition intact and without obvious caries or gingival swelling.  Oropharynx without erythema, exudate, or swelling.  Neck: supple/nontender.  No LAD, mass, or TM.  Carotid  pulses 2+ bilaterally, without bruits. CV: RRR, no m/r/g.   LUNGS: CTA bilat, nonlabored resps, good aeration in all lung fields. ABD: soft, NT, ND, BS normal.  No hepatospenomegaly or mass.  No bruits. EXT: no clubbing, cyanosis, or edema.  Musculoskeletal: no joint swelling, erythema, warmth, or tenderness.  ROM of all joints intact. Skin - no sores or suspicious lesions or rashes or color changes  Pertinent labs:  Lab Results  Component Value Date   TSH 6.19 (H) 08/02/2020   T3TOTAL 149 08/02/2020    Lab Results  Component Value Date   WBC 11.1 (H) 01/26/2020   HGB 13.8 01/26/2020   HCT 41.2 01/26/2020   MCV 89.2 01/26/2020   PLT 357.0 01/26/2020   Lab Results  Component Value Date   CREATININE 0.84 08/02/2020   BUN 11 08/02/2020   NA 136 08/02/2020  K 4.4 08/02/2020   CL 102 08/02/2020   CO2 21 08/02/2020   Lab Results  Component Value Date   ALT 30 08/02/2020   AST 30 08/02/2020   ALKPHOS 68 08/02/2020   BILITOT 0.5 08/02/2020   Lab Results  Component Value Date   CHOL 201 (H) 08/02/2020   Lab Results  Component Value Date   HDL 61.40 08/02/2020   Lab Results  Component Value Date   LDLCALC 83 10/26/2019   Lab Results  Component Value Date   TRIG 232.0 (H) 08/02/2020   Lab Results  Component Value Date   CHOLHDL 3 08/02/2020   Lab Results  Component Value Date   HGBA1C 5.8 (H) 08/02/2020   ASSESSMENT AND PLAN:   #1.  Obesity, BMI of 40.  Follows weight watchers from a food choices standpoint but reports overeating due to excessive hunger. She failed a trial of phentermine via her gynecologist a couple of years ago.  It did not result in any meaningful decrease in appetite or decrease in intake of food. She declined referral to bariatric clinic today but was in favor of starting Ozempic for weight loss assistance.  Ozempic prescribed today, started 0.25 mg subcu weekly x4, then increase to 0.5 mg subcu weekly.  I will see her back in 6 weeks to  assess for side effects and progress.  #2 GAD.  Stable on sertraline 100 mg a day long-term.  Continue this.  3.  Hyperlipidemia.  Tolerating atorvastatin 20 mg a day. Lipid panel and hepatic panel today.  4.  Prediabetes. Fasting sugar and hemoglobin A1c checked today.  5.  Subclinical hypothyroidism. Thyroid panel today.  6. Health maintenance exam: Reviewed age and gender appropriate health maintenance issues (prudent diet, regular exercise, health risks of tobacco and excessive alcohol, use of seatbelts, fire alarms in home, use of sunscreen).  Also reviewed age and gender appropriate health screening as well as vaccine recommendations. Vaccines: all utd. Labs: cbc, thyroid panel, FLP, cmet, hba1c. Cervical ca screening: per GYN MD, Dr. Cory Roughen to schedule appt.  An After Visit Summary was printed and given to the patient.  FOLLOW UP:  Return in about 6 weeks (around 04/20/2021) for f/u wt/med.  Signed:  Crissie Sickles, MD           03/09/2021

## 2021-03-10 ENCOUNTER — Telehealth: Payer: Self-pay

## 2021-03-10 ENCOUNTER — Other Ambulatory Visit (HOSPITAL_COMMUNITY): Payer: Self-pay

## 2021-03-10 MED ORDER — ATORVASTATIN CALCIUM 40 MG PO TABS
40.0000 mg | ORAL_TABLET | Freq: Every day | ORAL | 2 refills | Status: DC
  Filled 2021-03-10: qty 30, 30d supply, fill #0
  Filled 2021-04-20: qty 30, 30d supply, fill #1
  Filled 2021-05-23: qty 30, 30d supply, fill #2

## 2021-03-10 NOTE — Telephone Encounter (Signed)
-----   Message from Jeoffrey Massed, MD sent at 03/10/2021  9:40 AM EST ----- LDL cholesterol is elevated to 153, would like her to increase her atorvastatin to 40mg  daily. Pls do rx for 40mg  tabs, #30, RF x 2.  We'll rpt lipid panel at a future visit. Hba1c up to 6% from 5.8% last check.   Mild subclinical hypothyroidism stable. All other labs normal. Keep f/u as planned.

## 2021-04-20 ENCOUNTER — Other Ambulatory Visit: Payer: Self-pay

## 2021-04-20 ENCOUNTER — Other Ambulatory Visit (HOSPITAL_COMMUNITY): Payer: Self-pay

## 2021-04-20 ENCOUNTER — Ambulatory Visit (INDEPENDENT_AMBULATORY_CARE_PROVIDER_SITE_OTHER): Payer: No Typology Code available for payment source | Admitting: Family Medicine

## 2021-04-20 ENCOUNTER — Encounter: Payer: Self-pay | Admitting: Family Medicine

## 2021-04-20 DIAGNOSIS — R7303 Prediabetes: Secondary | ICD-10-CM

## 2021-04-20 DIAGNOSIS — E7800 Pure hypercholesterolemia, unspecified: Secondary | ICD-10-CM

## 2021-04-20 DIAGNOSIS — Z7689 Persons encountering health services in other specified circumstances: Secondary | ICD-10-CM

## 2021-04-20 MED ORDER — OZEMPIC (0.25 OR 0.5 MG/DOSE) 2 MG/1.5ML ~~LOC~~ SOPN
PEN_INJECTOR | SUBCUTANEOUS | 0 refills | Status: DC
  Filled 2021-04-20: qty 1.5, 28d supply, fill #0

## 2021-04-20 MED ORDER — OZEMPIC (1 MG/DOSE) 4 MG/3ML ~~LOC~~ SOPN
1.0000 mg | PEN_INJECTOR | SUBCUTANEOUS | 1 refills | Status: DC
  Filled 2021-04-20 – 2021-05-04 (×2): qty 3, 28d supply, fill #0

## 2021-04-20 NOTE — Progress Notes (Signed)
OFFICE VISIT  04/20/2021  CC:  Chief Complaint  Patient presents with   Follow-up    Morbid obesity; doing well on Ozempic.     HPI:    Patient is a 26 y.o. female who presents for 5 week f/u morbid obesity with HLD and prediabetes. Wt loss counseling/med mgmt-->ozempic. A/P as of last visit: "#1.  Obesity, BMI of 40.  Follows weight watchers from a food choices standpoint but reports overeating due to excessive hunger. She failed a trial of phentermine via her gynecologist a couple of years ago.  It did not result in any meaningful decrease in appetite or decrease in intake of food. She declined referral to bariatric clinic today but was in favor of starting Ozempic for weight loss assistance.  Ozempic prescribed today, started 0.25 mg subcu weekly x4, then increase to 0.5 mg subcu weekly.  I will see her back in 6 weeks to assess for side effects and progress.   #2 GAD.  Stable on sertraline 100 mg a day long-term.  Continue this.  3.  Hyperlipidemia.  Tolerating atorvastatin 20 mg a day. Lipid panel and hepatic panel today.  4.  Prediabetes. Fasting sugar and hemoglobin A1c checked today.  5.  Subclinical hypothyroidism. Thyroid panel today.   6. Health maintenance exam: Reviewed age and gender appropriate health maintenance issues (prudent diet, regular exercise, health risks of tobacco and excessive alcohol, use of seatbelts, fire alarms in home, use of sunscreen).  Also reviewed age and gender appropriate health screening as well as vaccine recommendations. Vaccines: all utd. Labs: cbc, thyroid panel, FLP, cmet, hba1c. Cervical ca screening: per GYN MD, Dr. Cory Roughen to schedule appt."  INTERIM HX: Last visit all her labs were stable except A1c was up a little to 6.0% and LDL was elevated so I increased her atorvastatin to 40 mg a day. Brittany Cooper is doing well. Had some nausea and some crampy abdominal pain for a few days after her first 0.25 mg Ozempic dose. No recurrence  with subsequent doses.  She did her first 0.5 mg dose a week ago, no problems. Appetite has been significantly less on this medication, finds her self eating less volume and better food choices. Her weight is down 13 pounds over the last 5 weeks.  Past Medical History:  Diagnosis Date   Frequent headaches    GAD (generalized anxiety disorder)    GERD (gastroesophageal reflux disease)    Gout 11/2016   History of cholecystitis 11/2019   with gallstones; cholecystectomy 11/06/19   Hyperlipemia, mixed    recommended statin 09/2019   Hypersomnolence 2019   Noct polysomn + multiple sleep latency testing planned as of Nov 2019 neuro eval.   Migraines    Obesity, Class II, BMI 35-39.9    PMDD (premenstrual dysphoric disorder) 11/2016   Postcholecystectomy syndrome    Prediabetes    Subclinical hypothyroidism 02/2018   + TPO ab mildly elevated-->evolving hashimoto's.  TSH back to normal 04/23/2018.    Past Surgical History:  Procedure Laterality Date   CHOLECYSTECTOMY N/A 11/06/2019   Procedure: LAPAROSCOPIC CHOLECYSTECTOMY WITH INDOCYANINE GREEN DYE;  Surgeon: Rolm Bookbinder, MD;  Location: [place];  Service: General;  Laterality: N/A;   TONSILLECTOMY     child    Outpatient Medications Prior to Visit  Medication Sig Dispense Refill   atorvastatin (LIPITOR) 40 MG tablet Take 1 tablet (40 mg total) by mouth daily. 30 tablet 2   norethindrone-ethinyl estradiol 1/35 (ALAYCEN 1/35) tablet Take 1 tablet  daily continously, taking active pills only 84 tablet 0   omeprazole (PRILOSEC) 40 MG capsule TAKE 1 CAPSULE (40 MG TOTAL) BY MOUTH IN THE MORNING AND 1 AT BEDTIME. 60 capsule 11   ondansetron (ZOFRAN-ODT) 4 MG disintegrating tablet DISSOLVE 1 - 2 TABLETS BY MOUTH 3 TIMES DAILY AS NEEDED FOR NAUSEA 30 tablet 1   sertraline (ZOLOFT) 100 MG tablet TAKE 1 TABLET BY MOUTH DAILY 90 tablet 3   Semaglutide,0.25 or 0.5MG /DOS, (OZEMPIC, 0.25 OR 0.5 MG/DOSE,) 2 MG/1.5ML SOPN Inject  0.25mg  into the skin once a week for 4 weeks then increase to 0.5 MG into the skin once a week for 4 weeks 1.5 mL 0   No facility-administered medications prior to visit.    No Known Allergies  ROS As per HPI  PE: Vitals with BMI 04/20/2021 03/09/2021 08/02/2020  Height 5\' 3"  5\' 3"  5' 2.5"  Weight 210 lbs 10 oz 223 lbs 10 oz 236 lbs 3 oz  BMI 37.32 [id number]  Systolic [id number]  Diastolic 73 80 78  Pulse 87 97 92     Physical Exam  Gen: Alert, well appearing.  Patient is oriented to person, place, time, and situation. AFFECT: pleasant, lucid thought and speech. No further exam today.  LABS:  Last CBC Lab Results  Component Value Date   WBC 10.5 03/09/2021   HGB 13.8 03/09/2021   HCT 41.2 03/09/2021   MCV 87.0 03/09/2021   MCH 30.0 11/01/2019   RDW 13.6 03/09/2021   PLT 381.0 0000000   Last metabolic panel Lab Results  Component Value Date   GLUCOSE 74 03/09/2021   NA 136 03/09/2021   K 4.3 03/09/2021   CL 103 03/09/2021   CO2 18 (L) 03/09/2021   BUN 13 03/09/2021   CREATININE 0.95 03/09/2021   GFRNONAA >60 11/04/2019   CALCIUM 10.0 03/09/2021   PROT 8.4 (H) 03/09/2021   ALBUMIN 4.3 03/09/2021   BILITOT 0.5 03/09/2021   ALKPHOS 72 03/09/2021   AST 28 03/09/2021   ALT 32 03/09/2021   ANIONGAP 9 11/04/2019   Last lipids Lab Results  Component Value Date   CHOL 250 (H) 03/09/2021   HDL 65.80 03/09/2021   LDLCALC 153 (H) 03/09/2021   LDLDIRECT 117.0 08/02/2020   TRIG 155.0 (H) 03/09/2021   CHOLHDL 4 03/09/2021   Last hemoglobin A1c Lab Results  Component Value Date   HGBA1C 6.0 03/09/2021   Last thyroid functions Lab Results  Component Value Date   TSH 6.83 (H) 03/09/2021   T3TOTAL 142 03/09/2021   IMPRESSION AND PLAN:  Morbid obesity.  Complicated by hyperlipidemia and prediabetes. 13 pound weight loss over the last 5 weeks, patient happy. Continue 0.5 mg subcu weekly for the next 3 weeks and then increase to 1 mg weekly.  I will see  her back in 6 weeks and we will recheck hemoglobin A1c and lipid panel at that time. Of note, she is tolerating the recently increased Lipitor dose well.  An After Visit Summary was printed and given to the patient.  FOLLOW UP: Return in about 6 weeks (around 06/01/2021) for f/u wt mgmt/med.  Signed:  Crissie Sickles, MD           04/20/2021

## 2021-04-21 ENCOUNTER — Other Ambulatory Visit (HOSPITAL_COMMUNITY): Payer: Self-pay

## 2021-04-22 ENCOUNTER — Other Ambulatory Visit (HOSPITAL_COMMUNITY): Payer: Self-pay

## 2021-05-04 ENCOUNTER — Other Ambulatory Visit (HOSPITAL_COMMUNITY): Payer: Self-pay

## 2021-05-06 ENCOUNTER — Other Ambulatory Visit (HOSPITAL_COMMUNITY): Payer: Self-pay

## 2021-05-06 ENCOUNTER — Other Ambulatory Visit: Payer: Self-pay | Admitting: Family Medicine

## 2021-05-06 MED ORDER — ONDANSETRON 4 MG PO TBDP
ORAL_TABLET | ORAL | 1 refills | Status: DC
  Filled 2021-05-06: qty 30, 5d supply, fill #0
  Filled 2021-05-23: qty 30, 5d supply, fill #1

## 2021-05-12 ENCOUNTER — Other Ambulatory Visit (HOSPITAL_COMMUNITY): Payer: Self-pay

## 2021-05-12 MED ORDER — NORETHINDRONE-ETH ESTRADIOL 1-35 MG-MCG PO TABS
ORAL_TABLET | ORAL | 0 refills | Status: DC
  Filled 2021-05-12: qty 84, 84d supply, fill #0

## 2021-05-23 ENCOUNTER — Other Ambulatory Visit (HOSPITAL_COMMUNITY): Payer: Self-pay

## 2021-05-31 ENCOUNTER — Ambulatory Visit (INDEPENDENT_AMBULATORY_CARE_PROVIDER_SITE_OTHER): Payer: No Typology Code available for payment source | Admitting: Family Medicine

## 2021-05-31 ENCOUNTER — Other Ambulatory Visit (HOSPITAL_COMMUNITY): Payer: Self-pay

## 2021-05-31 ENCOUNTER — Encounter: Payer: Self-pay | Admitting: Family Medicine

## 2021-05-31 ENCOUNTER — Other Ambulatory Visit: Payer: Self-pay

## 2021-05-31 VITALS — BP 107/71 | HR 88 | Temp 98.1°F | Ht 63.0 in | Wt 198.2 lb

## 2021-05-31 DIAGNOSIS — E7800 Pure hypercholesterolemia, unspecified: Secondary | ICD-10-CM | POA: Diagnosis not present

## 2021-05-31 DIAGNOSIS — Z7689 Persons encountering health services in other specified circumstances: Secondary | ICD-10-CM

## 2021-05-31 DIAGNOSIS — R7303 Prediabetes: Secondary | ICD-10-CM | POA: Diagnosis not present

## 2021-05-31 MED ORDER — OZEMPIC (1 MG/DOSE) 4 MG/3ML ~~LOC~~ SOPN
1.0000 mg | PEN_INJECTOR | SUBCUTANEOUS | 0 refills | Status: DC
  Filled 2021-05-31: qty 3, 28d supply, fill #0

## 2021-05-31 NOTE — Progress Notes (Signed)
OFFICE VISIT  05/31/2021  CC:  Chief Complaint  Patient presents with   Weight Management    Pt is doing well on medication.     Patient is a 26 y.o. female who presents for 6-week follow-up obesity and HLD. medication-assisted weight loss counseling. I started her on Ozempic approx 2 mo ago.  INTERIM HX: Brittany Cooper continues to do well. She does have some nausea for a couple days after each injection, responds well to 4 mg Zofran usually only once a day for couple days.  She feels like she is maintained good dietary habits.  As the warm weather approaches she and her mom have plans to increase exercise outside. She has lost about 23 pounds since starting Ozempic, says her goal weight is around 145 to 150 pounds.  She is still doing well on atorvastatin 40 mg daily dosing, no side effects.  Past Medical History:  Diagnosis Date   Frequent headaches    GAD (generalized anxiety disorder)    GERD (gastroesophageal reflux disease)    Gout 11/2016   History of cholecystitis 11/2019   with gallstones; cholecystectomy 11/06/19   Hyperlipemia, mixed    recommended statin 09/2019   Hypersomnolence 2019   Noct polysomn + multiple sleep latency testing planned as of Nov 2019 neuro eval.   Migraines    Obesity, Class II, BMI 35-39.9    PMDD (premenstrual dysphoric disorder) 11/2016   Postcholecystectomy syndrome    Prediabetes    Subclinical hypothyroidism 02/2018   + TPO ab mildly elevated-->evolving hashimoto's.  TSH back to normal 04/23/2018.    Past Surgical History:  Procedure Laterality Date   CHOLECYSTECTOMY N/A 11/06/2019   Procedure: LAPAROSCOPIC CHOLECYSTECTOMY WITH INDOCYANINE GREEN DYE;  Surgeon: Rolm Bookbinder, MD;  Location: [place];  Service: General;  Laterality: N/A;   TONSILLECTOMY     child    Outpatient Medications Prior to Visit  Medication Sig Dispense Refill   atorvastatin (LIPITOR) 40 MG tablet Take 1 tablet (40 mg total) by mouth daily. 30  tablet 2   norethindrone-ethinyl estradiol 1/35 (ORTHO-NOVUM) tablet Take 1 tablet by mouth at the same time every day. Take continuously skipping placebos as directed. 84 tablet 0   omeprazole (PRILOSEC) 40 MG capsule TAKE 1 CAPSULE (40 MG TOTAL) BY MOUTH IN THE MORNING AND 1 AT BEDTIME. 60 capsule 11   ondansetron (ZOFRAN-ODT) 4 MG disintegrating tablet DISSOLVE 1 - 2 TABLETS BY MOUTH 3 TIMES DAILY AS NEEDED FOR NAUSEA 30 tablet 1   sertraline (ZOLOFT) 100 MG tablet TAKE 1 TABLET BY MOUTH DAILY 90 tablet 3   Semaglutide, 1 MG/DOSE, (OZEMPIC, 1 MG/DOSE,) 4 MG/3ML SOPN Inject 1 mg into the skin once a week 3 mL 1   Semaglutide,0.25 or 0.5MG /DOS, (OZEMPIC, 0.25 OR 0.5 MG/DOSE,) 2 MG/1.5ML SOPN Inject 0.5 MG into the skin once a week for 2 weeks (Patient not taking: Reported on 05/31/2021) 1.5 mL 0   No facility-administered medications prior to visit.    No Known Allergies  ROS As per HPI  PE: Vitals with BMI 05/31/2021 04/20/2021 03/09/2021  Height 5\' 3"  5\' 3"  5\' 3"   Weight 198 lbs 3 oz 210 lbs 10 oz 223 lbs 10 oz  BMI 35.12 [id number]  Systolic [id number]  Diastolic 71 73 80  Pulse 88 87 97     Physical Exam  Gen1 AFFECT: pleasant, lucid thought and speech. No further exam today.  LABS:  Last metabolic panel Lab Results  Component Value  Date   GLUCOSE 74 03/09/2021   NA 136 03/09/2021   K 4.3 03/09/2021   CL 103 03/09/2021   CO2 18 (L) 03/09/2021   BUN 13 03/09/2021   CREATININE 0.95 03/09/2021   GFRNONAA >60 11/04/2019   CALCIUM 10.0 03/09/2021   PROT 8.4 (H) 03/09/2021   ALBUMIN 4.3 03/09/2021   BILITOT 0.5 03/09/2021   ALKPHOS 72 03/09/2021   AST 28 03/09/2021   ALT 32 03/09/2021   ANIONGAP 9 11/04/2019   Last lipids Lab Results  Component Value Date   CHOL 250 (H) 03/09/2021   HDL 65.80 03/09/2021   LDLCALC 153 (H) 03/09/2021   LDLDIRECT 117.0 08/02/2020   TRIG 155.0 (H) 03/09/2021   CHOLHDL 4 03/09/2021   Last hemoglobin A1c Lab Results  Component  Value Date   HGBA1C 6.0 03/09/2021   IMPRESSION AND PLAN:  1) Obesity, weight loss management.   Doing well on Ozempic treatment for this, BMI is down from 39.6to 35.1 over the last couple months on this medication. Continue 1 mg subcu weekly Ozempic.  Continue Zofran as needed nausea. At next visit we will have to discuss change over to wegovy b/c of insurer formulary changes-->ozempic will no longer be covered (patient states Wegovy and [employer]ke Shag[employer] would be approved.  She prefers [place] because of its once a week dosing).  #2 prediabetes.  Dietary modification/weight loss as stated above. Plan repeat hemoglobin A1c in 1 month.  3.  Hyperlipidemia, tolerating atorvastatin 40 mg a day. She will be due for lipid repeat at next follow-up in 1 month.  An After Visit Summary was printed and given to the patient.  FOLLOW UP: Return in about 4 weeks (around 06/28/2021) for routine chronic illness f/u.  Signed:  Crissie Sickles, MD           05/31/2021

## 2021-06-15 ENCOUNTER — Other Ambulatory Visit: Payer: Self-pay | Admitting: Family Medicine

## 2021-06-15 ENCOUNTER — Other Ambulatory Visit (HOSPITAL_COMMUNITY): Payer: Self-pay

## 2021-06-15 MED ORDER — ONDANSETRON 4 MG PO TBDP
ORAL_TABLET | ORAL | 1 refills | Status: DC
  Filled 2021-06-15: qty 60, 10d supply, fill #0
  Filled 2021-08-10: qty 2, 1d supply, fill #1
  Filled 2021-08-19: qty 58, 9d supply, fill #1

## 2021-06-15 NOTE — Telephone Encounter (Signed)
Pt requesting qty increase from #30 to #60 for home delivery thru [facility].Last RF 05/06/21(30,1) ? ? ?Please review and advise ? ?

## 2021-06-20 ENCOUNTER — Other Ambulatory Visit: Payer: Self-pay | Admitting: Family Medicine

## 2021-06-21 ENCOUNTER — Other Ambulatory Visit (HOSPITAL_COMMUNITY): Payer: Self-pay

## 2021-06-21 MED ORDER — ATORVASTATIN CALCIUM 40 MG PO TABS
40.0000 mg | ORAL_TABLET | Freq: Every day | ORAL | 1 refills | Status: DC
  Filled 2021-06-21: qty 90, 90d supply, fill #0
  Filled 2021-09-12: qty 90, 90d supply, fill #1

## 2021-06-24 ENCOUNTER — Other Ambulatory Visit: Payer: Self-pay | Admitting: Family Medicine

## 2021-06-24 ENCOUNTER — Other Ambulatory Visit (HOSPITAL_COMMUNITY): Payer: Self-pay

## 2021-06-24 MED ORDER — OZEMPIC (1 MG/DOSE) 4 MG/3ML ~~LOC~~ SOPN
1.0000 mg | PEN_INJECTOR | SUBCUTANEOUS | 0 refills | Status: DC
Start: 2021-06-24 — End: 2021-07-28
  Filled 2021-06-24: qty 3, 28d supply, fill #0

## 2021-06-24 NOTE — Telephone Encounter (Signed)
Patient commented: Requesting x1 RF before insurance stops paying for medication on April 1st. Have appt on 03/29 to change med to [facility], but it will require PA first. This Ozempic RF will allow me to have med while MD completes PA with insurance. Thanks ?

## 2021-06-29 ENCOUNTER — Ambulatory Visit (INDEPENDENT_AMBULATORY_CARE_PROVIDER_SITE_OTHER): Payer: No Typology Code available for payment source | Admitting: Family Medicine

## 2021-06-29 ENCOUNTER — Encounter: Payer: Self-pay | Admitting: Family Medicine

## 2021-06-29 VITALS — BP 112/76 | HR 85 | Temp 98.1°F | Ht 63.0 in | Wt 191.6 lb

## 2021-06-29 DIAGNOSIS — E7800 Pure hypercholesterolemia, unspecified: Secondary | ICD-10-CM | POA: Diagnosis not present

## 2021-06-29 DIAGNOSIS — Z7689 Persons encountering health services in other specified circumstances: Secondary | ICD-10-CM | POA: Diagnosis not present

## 2021-06-29 DIAGNOSIS — R7303 Prediabetes: Secondary | ICD-10-CM | POA: Diagnosis not present

## 2021-06-29 LAB — COMPREHENSIVE METABOLIC PANEL
ALT: 45 U/L — ABNORMAL HIGH (ref 0–35)
AST: 26 U/L (ref 0–37)
Albumin: 4.3 g/dL (ref 3.5–5.2)
Alkaline Phosphatase: 61 U/L (ref 39–117)
BUN: 10 mg/dL (ref 6–23)
CO2: 26 mEq/L (ref 19–32)
Calcium: 9.5 mg/dL (ref 8.4–10.5)
Chloride: 103 mEq/L (ref 96–112)
Creatinine, Ser: 0.89 mg/dL (ref 0.40–1.20)
GFR: 88.91 mL/min (ref >60.00)
Glucose, Bld: 78 mg/dL (ref 70–99)
Potassium: 4.2 mEq/L (ref 3.5–5.1)
Sodium: 137 mEq/L (ref 135–145)
Total Bilirubin: 0.4 mg/dL (ref 0.2–1.2)
Total Protein: 7.5 g/dL (ref 6.0–8.3)

## 2021-06-29 LAB — LIPID PANEL
Cholesterol: 215 mg/dL — ABNORMAL HIGH (ref 0–200)
HDL: 55.7 mg/dL (ref >39.00)
LDL Cholesterol: 136 mg/dL — ABNORMAL HIGH (ref 0–99)
NonHDL: 158.81
Total CHOL/HDL Ratio: 4
Triglycerides: 114 mg/dL (ref 0.0–149.0)
VLDL: 22.8 mg/dL (ref 0.0–40.0)

## 2021-06-29 LAB — HEMOGLOBIN A1C: Hgb A1c MFr Bld: 5.8 % (ref 4.6–6.5)

## 2021-06-29 NOTE — Progress Notes (Signed)
OFFICE VISIT ? ?06/29/2021 ? ?CC:  ?Chief Complaint  ?Patient presents with  ? Weight Loss  ? ? ?HPI:   ? ?Patient is a 26 y.o. female who presents for 1 mo f/u obesity/wt loss mgmt. ?A/P as of last visit: ?"1) Obesity, weight loss management.   ?Doing well on Ozempic treatment for this, BMI is down from 39.6to 35.1 over the last couple months on this medication. ?Continue 1 mg subcu weekly Ozempic.  Continue Zofran as needed nausea. ?At next visit we will have to discuss change over to wegovy b/c of insurer formulary changes-->ozempic will no longer be covered (patient states Wegovy and [employer] would be approved.  She prefers [place] because of its once a week dosing). ?  ?#2 prediabetes.  Dietary modification/weight loss as stated above. ?Plan repeat hemoglobin A1c in 1 month. ? ?3.  Hyperlipidemia, tolerating atorvastatin 40 mg a day. ?She will be due for lipid repeat at next follow-up in 1 month" ? ?INTERIM HX: ?Doing very well. ?Ozempic 1 mg weekly dosing for the last 6 weeks.  She takes injection #7 tomorrow. ?She has about a month worth of Ozempic after that and when she runs out of this she will need to switch over to [facility] per insurance requirement. ?Exercising excellent, great diet. ?Has mild shinsplints the last few days because she overdid it walking.  This often happens and she rests a few days and it resolves. ? ?Tolerating atorvastatin 40 mg a day very well ? ?ROS as above, plus--> no fevers, no CP, no SOB, no wheezing, no cough, no dizziness, no HAs, no rashes, no melena/hematochezia.  No polyuria or polydipsia.  No myalgias or arthralgias.  No focal weakness, paresthesias, or tremors.  No acute vision or hearing abnormalities.  No dysuria or unusual/new urinary urgency or frequency.  No recent changes in lower legs. ?No n/v/d or abd pain.  No palpitations.   ? ? ?Past Medical History:  ?Diagnosis Date  ? Frequent headaches   ? GAD (generalized anxiety disorder)   ? GERD (gastroesophageal reflux  disease)   ? Gout 11/2016  ? History of cholecystitis 11/2019  ? with gallstones; cholecystectomy 11/06/19  ? Hyperlipemia, mixed   ? recommended statin 09/2019  ? Hypersomnolence 2019  ? Noct polysomn + multiple sleep latency testing planned as of Nov 2019 neuro eval.  ? Migraines   ? Obesity, Class II, BMI 35-39.9   ? PMDD (premenstrual dysphoric disorder) 11/2016  ? Postcholecystectomy syndrome   ? Prediabetes   ? Subclinical hypothyroidism 02/2018  ? + TPO ab mildly elevated-->evolving hashimoto's.  TSH back to normal 04/23/2018.  ? ? ?Past Surgical History:  ?Procedure Laterality Date  ? CHOLECYSTECTOMY N/A 11/06/2019  ? Procedure: LAPAROSCOPIC CHOLECYSTECTOMY WITH INDOCYANINE GREEN DYE;  Surgeon: Emelia Loron, MD;  Location: [facility];  Service: General;  Laterality: N/A;  ? TONSILLECTOMY    ? child  ? ? ?Outpatient Medications Prior to Visit  ?Medication Sig Dispense Refill  ? atorvastatin (LIPITOR) 40 MG tablet Take 1 tablet (40 mg total) by mouth daily. 90 tablet 1  ? norethindrone-ethinyl estradiol 1/35 (ORTHO-NOVUM) tablet Take 1 tablet by mouth at the same time every day. Take continuously skipping placebos as directed. 84 tablet 0  ? omeprazole (PRILOSEC) 40 MG capsule TAKE 1 CAPSULE (40 MG TOTAL) BY MOUTH IN THE MORNING AND 1 AT BEDTIME. 60 capsule 11  ? ondansetron (ZOFRAN-ODT) 4 MG disintegrating tablet DISSOLVE 1 - 2 TABLETS BY MOUTH 3 TIMES DAILY AS  NEEDED FOR NAUSEA 60 tablet 1  ? Semaglutide, 1 MG/DOSE, (OZEMPIC, 1 MG/DOSE,) 4 MG/3ML SOPN Inject 1 mg into the skin once a week 3 mL 0  ? sertraline (ZOLOFT) 100 MG tablet TAKE 1 TABLET BY MOUTH DAILY 90 tablet 3  ? ?No facility-administered medications prior to visit.  ? ? ?No Known Allergies ? ?ROS ?As per HPI ? ?PE: ? ?  06/29/2021  ?  8:34 AM 05/31/2021  ?  8:33 AM 04/20/2021  ?  9:29 AM  ?Vitals with BMI  ?Height 5\' 3"  5\' 3"  5\' 3"   ?Weight 191 lbs 10 oz 198 lbs 3 oz 210 lbs 10 oz  ?BMI 33.95 35.12 37.32  ?Systolic 112 107   ?Diastolic 76 71 73  ?Pulse 85 88 87  ? ? ? ?Physical Exam ? ?Gen: Alert, well appearing.  Patient is oriented to person, place, time, and situation. ?AFFECT: pleasant, lucid thought and speech. ?No further exam today ? ?LABS:  ?Last CBC ?Lab Results  ?Component Value Date  ? WBC 10.5 03/09/2021  ? HGB 13.8 03/09/2021  ? HCT 41.2 03/09/2021  ? MCV 87.0 03/09/2021  ? MCH 30.0 11/01/2019  ? RDW 13.6 03/09/2021  ? PLT 381.0 03/09/2021  ? ?Last metabolic panel ?Lab Results  ?Component Value Date  ? GLUCOSE 74 03/09/2021  ? NA 136 03/09/2021  ? K 4.3 03/09/2021  ? CL 103 03/09/2021  ? CO2 18 (L) 03/09/2021  ? BUN 13 03/09/2021  ? CREATININE 0.95 03/09/2021  ? GFRNONAA >60 11/04/2019  ? CALCIUM 10.0 03/09/2021  ? PROT 8.4 (H) 03/09/2021  ? ALBUMIN 4.3 03/09/2021  ? BILITOT 0.5 03/09/2021  ? ALKPHOS 72 03/09/2021  ? AST 28 03/09/2021  ? ALT 32 03/09/2021  ? ANIONGAP 9 11/04/2019  ? ?Last lipids ?Lab Results  ?Component Value Date  ? CHOL 250 (H) 03/09/2021  ? HDL 65.80 03/09/2021  ? LDLCALC 153 (H) 03/09/2021  ? LDLDIRECT 117.0 08/02/2020  ? TRIG 155.0 (H) 03/09/2021  ? CHOLHDL 4 03/09/2021  ? ?Last hemoglobin A1c ?Lab Results  ?Component Value Date  ? HGBA1C 6.0 03/09/2021  ? ?Last thyroid functions ?Lab Results  ?Component Value Date  ? TSH 6.83 (H) 03/09/2021  ? T3TOTAL 142 03/09/2021  ? ?IMPRESSION AND PLAN: ? ?#1 obesity class II, great improvement in weight loss efforts--has lost 35 pounds in the last 2-1/2 months with maximal diet, exercise, and use of Ozempic. ?In about a month when she is running out of Ozempic she will call 14/10/2020 and we will do new prescription for [facility] and continue the 1 mg weekly dosing. ? ?#2 hyperlipidemia--tolerating the relatively recent increase dose of atorvastatin to 40 mg a day. ?Lipid panel and hepatic panel today. ? ?3.  Prediabetes.  Great diet and exercise, weight loss+ Ozempic as noted above #1. ?Hemoglobin A1c today. ? ?An After Visit Summary was printed and given to the  patient. ? ?FOLLOW UP: Return in about 3 months (around 09/29/2021) for f/u wt mgmt. ? ?Signed:  [place], MD           06/29/2021 ? ?

## 2021-07-19 ENCOUNTER — Other Ambulatory Visit: Payer: Self-pay | Admitting: Family Medicine

## 2021-07-20 ENCOUNTER — Other Ambulatory Visit (HOSPITAL_COMMUNITY): Payer: Self-pay

## 2021-07-20 MED ORDER — SERTRALINE HCL 100 MG PO TABS
ORAL_TABLET | Freq: Every day | ORAL | 1 refills | Status: DC
  Filled 2021-07-20: qty 90, 90d supply, fill #0
  Filled 2021-10-27: qty 90, 90d supply, fill #1

## 2021-07-28 ENCOUNTER — Other Ambulatory Visit (HOSPITAL_COMMUNITY): Payer: Self-pay

## 2021-07-28 ENCOUNTER — Encounter: Payer: Self-pay | Admitting: Family Medicine

## 2021-07-28 MED ORDER — WEGOVY 1.7 MG/0.75ML ~~LOC~~ SOAJ
1.7000 mg | SUBCUTANEOUS | 1 refills | Status: DC
  Filled 2021-07-28 – 2021-08-24 (×2): qty 3, 28d supply, fill #0
  Filled 2021-09-12: qty 3, 28d supply, fill #1

## 2021-07-28 NOTE — Telephone Encounter (Signed)
Brittany Cooper prescription for 1.7 mg injection weekly sent today. ?

## 2021-07-28 NOTE — Telephone Encounter (Signed)
Please advise on script

## 2021-07-30 ENCOUNTER — Other Ambulatory Visit (HOSPITAL_COMMUNITY): Payer: Self-pay

## 2021-08-01 NOTE — Telephone Encounter (Signed)
Brittany Cooper (Key: [id number]) ?Wegovy 1.7MG /0.75ML auto-injectors ?  ?Form ?MedImpact ePA Form 2017 NCPDP ?

## 2021-08-08 NOTE — Telephone Encounter (Signed)
PA denied.

## 2021-08-10 ENCOUNTER — Other Ambulatory Visit: Payer: Self-pay | Admitting: Family Medicine

## 2021-08-10 ENCOUNTER — Other Ambulatory Visit (HOSPITAL_COMMUNITY): Payer: Self-pay

## 2021-08-10 MED ORDER — OMEPRAZOLE 40 MG PO CPDR
DELAYED_RELEASE_CAPSULE | ORAL | 11 refills | Status: DC
Start: 2021-08-10 — End: 2022-09-07
  Filled 2021-08-10: qty 60, 30d supply, fill #0
  Filled 2021-09-12: qty 60, 30d supply, fill #1
  Filled 2021-10-27: qty 60, 30d supply, fill #2
  Filled 2021-12-05: qty 60, 30d supply, fill #3
  Filled 2022-01-16: qty 60, 30d supply, fill #4
  Filled 2022-03-08: qty 60, 30d supply, fill #5
  Filled 2022-05-31: qty 60, 30d supply, fill #6
  Filled 2022-07-17 – 2022-08-02 (×2): qty 60, 30d supply, fill #7

## 2021-08-12 ENCOUNTER — Other Ambulatory Visit (HOSPITAL_COMMUNITY): Payer: Self-pay

## 2021-08-16 ENCOUNTER — Encounter: Payer: Self-pay | Admitting: Family Medicine

## 2021-08-17 NOTE — Telephone Encounter (Signed)
My documentation on 03/09/2021 states that she is on weight watchers.   ?Moshe Cipro, will you see if there is anything we can do for the PA?? ? ?

## 2021-08-19 ENCOUNTER — Other Ambulatory Visit (HOSPITAL_COMMUNITY): Payer: Self-pay

## 2021-08-19 NOTE — Telephone Encounter (Signed)
Currently working on PA 

## 2021-08-24 ENCOUNTER — Other Ambulatory Visit (HOSPITAL_COMMUNITY): Payer: Self-pay

## 2021-09-08 IMAGING — US US ABDOMEN LIMITED
1 series · 14 of 25 positions shown · non-contrast
Comparison: None.

CLINICAL DATA: Sporadic epigastric pain x1 month

EXAM:
ULTRASOUND ABDOMEN LIMITED RIGHT UPPER QUADRANT

[Series 1: us abdomen limited · 14 of 39 slices shown]
[im 1/39]
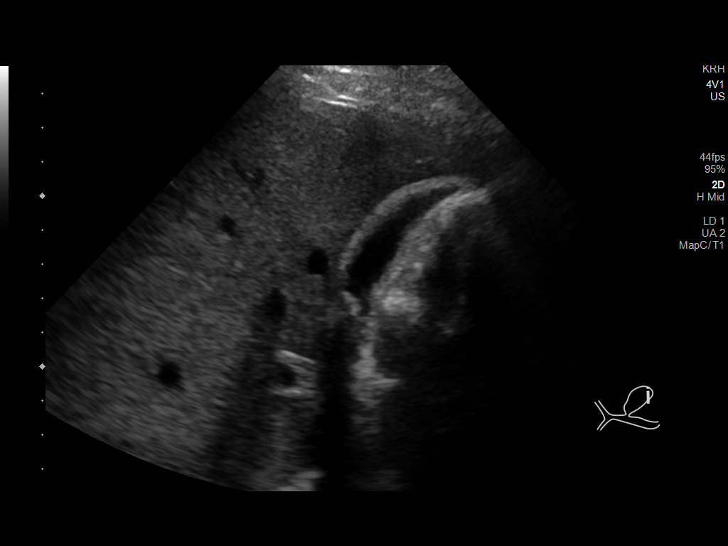
[im 4/39]
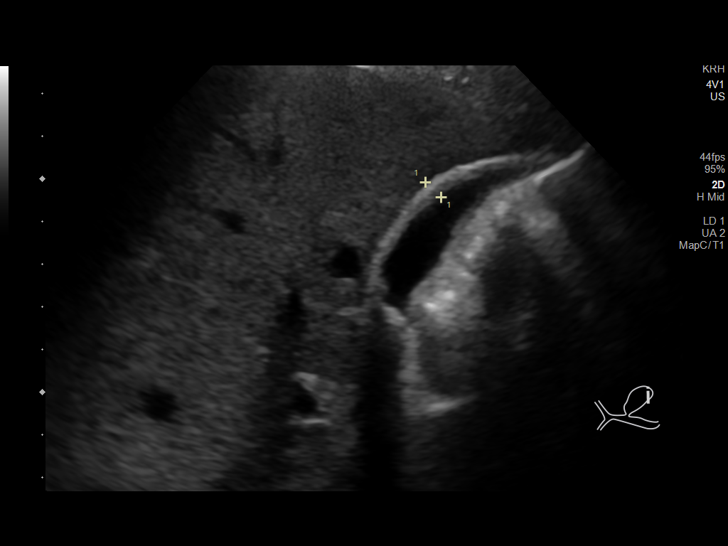
[im 7/39]
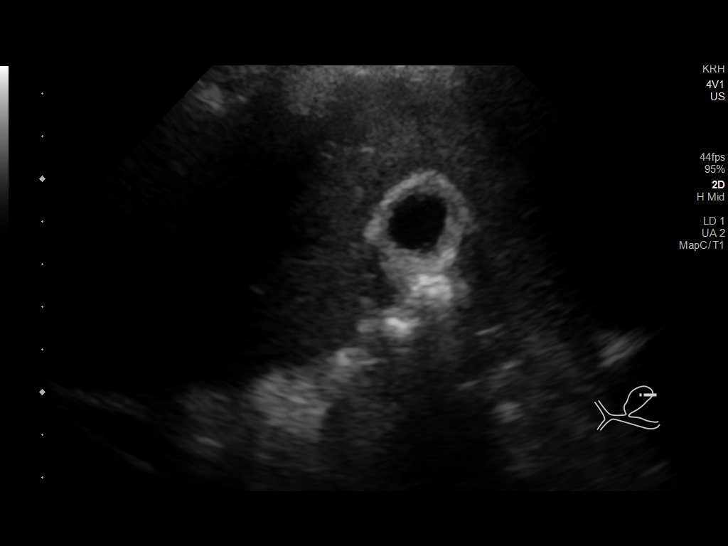
[im 10/39]
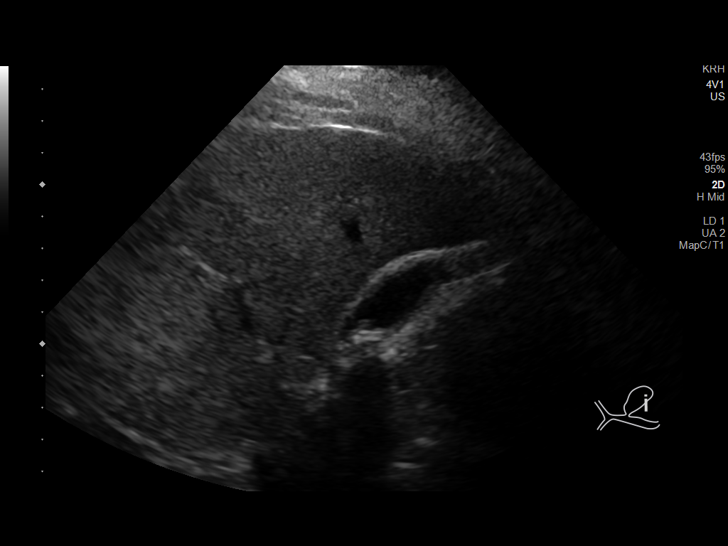
[im 13/39]
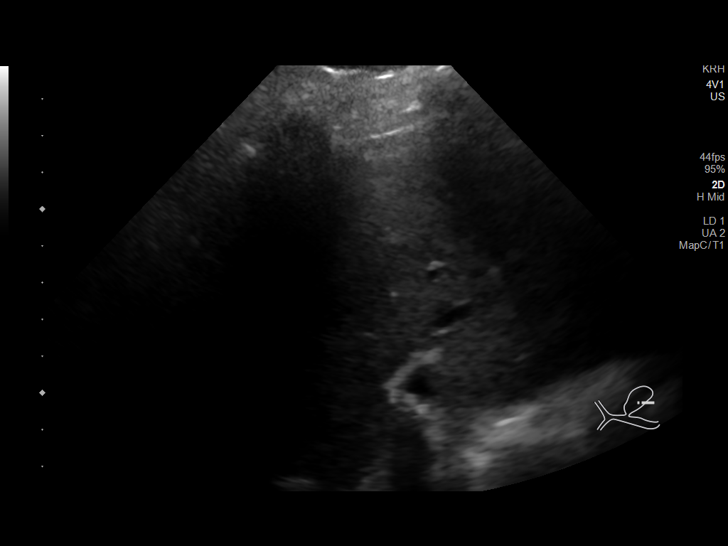
[im 15/39]
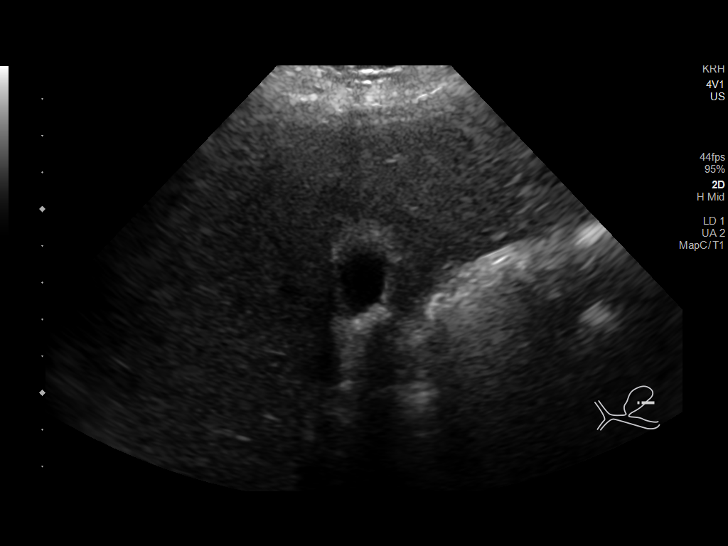
[im 18/39]
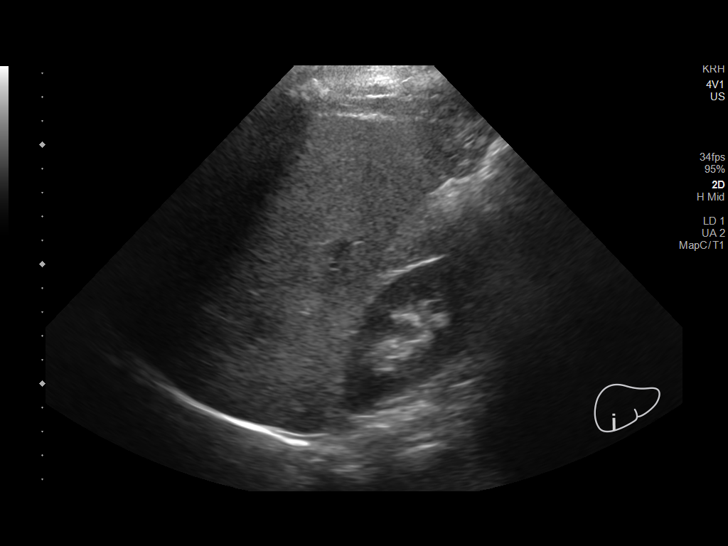
[im 21/39]
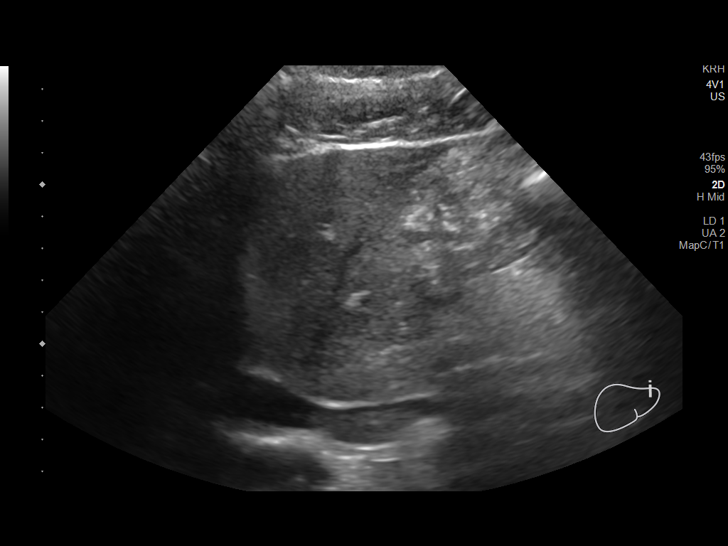
[im 24/39]
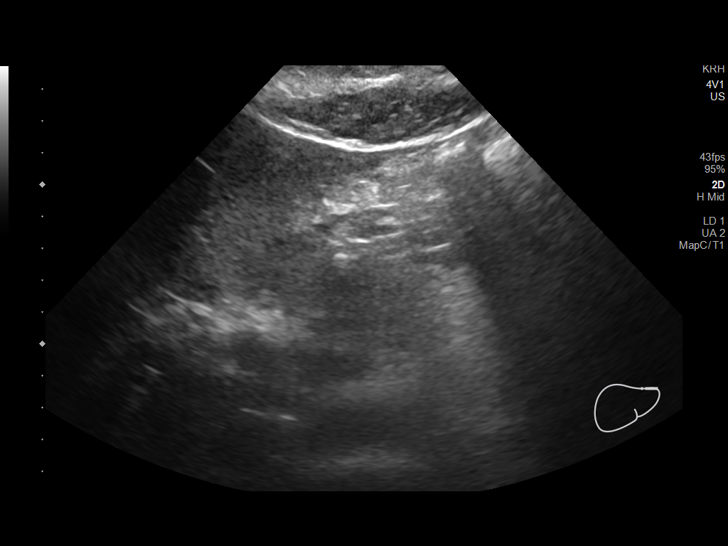
[im 26/39]
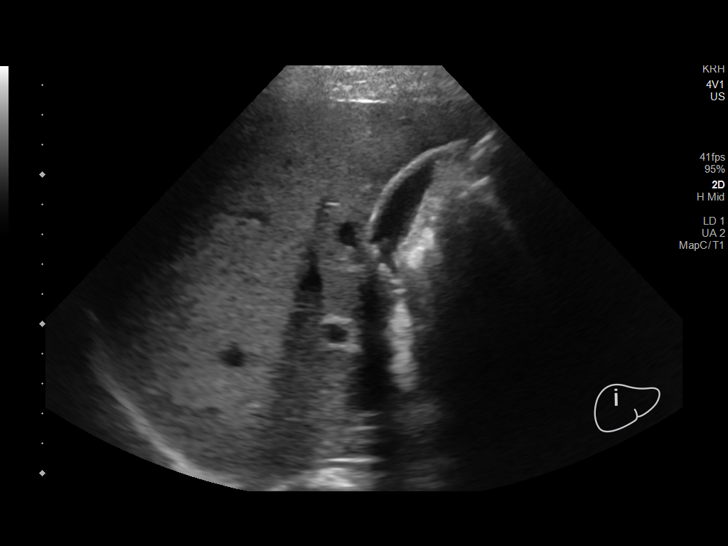
[im 29/39]
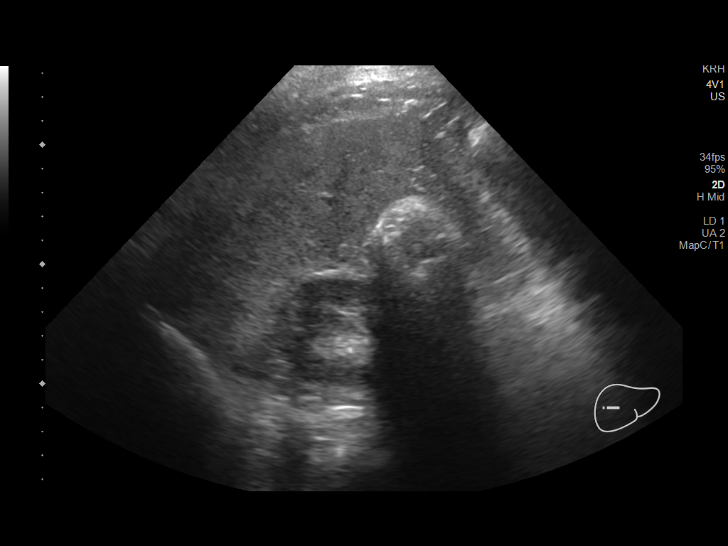
[im 32/39]
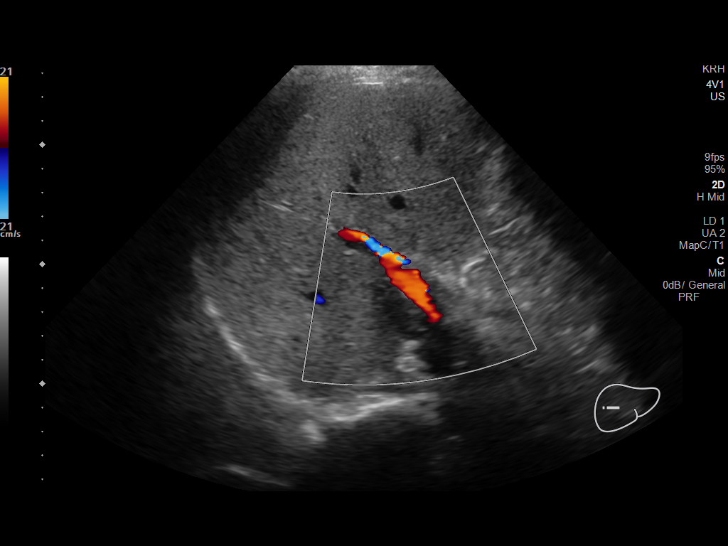
[im 35/39]
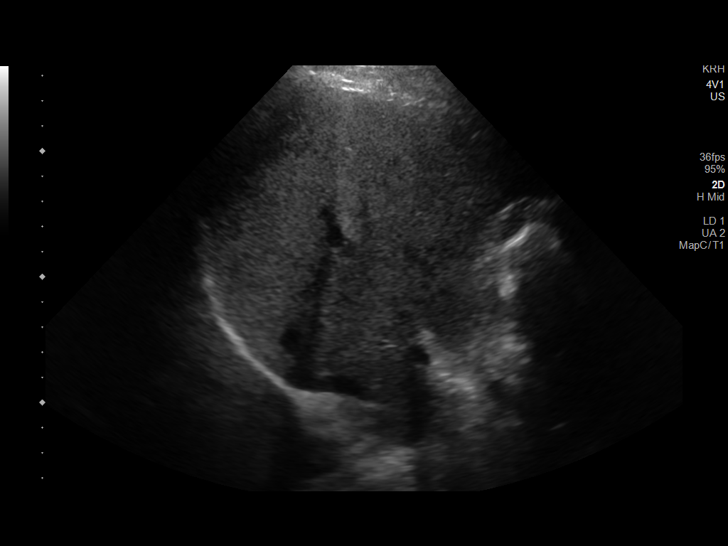
[im 39/39]
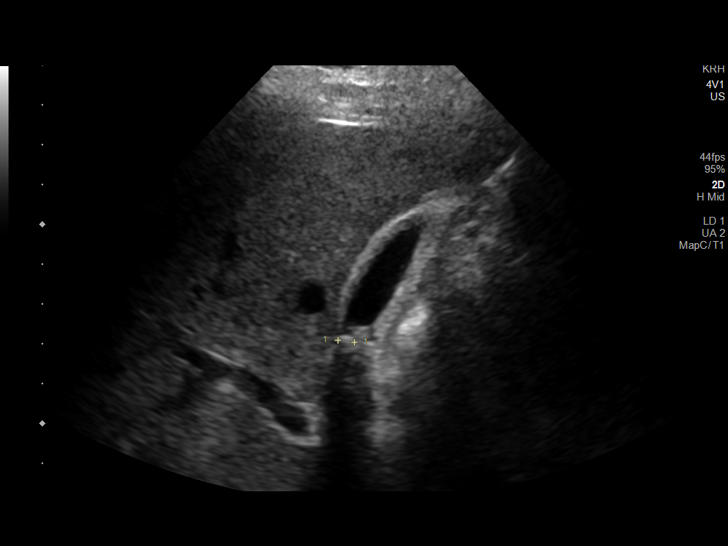

[14 of 25 positions shown; findings below may reference images not displayed]

FINDINGS: Gallbladder:

Incompletely distended. Wall measure up to 5 mm thickness.
Subcentimeter stones near the gallbladder neck. No pericholecystic
fluid. Sonographer describes no sonographic Murphy's sign.

Common bile duct:

Diameter: 3 mm, unremarkable

Liver:

No focal lesion identified. Within normal limits in parenchymal
echogenicity. Portal vein is patent on color Doppler imaging with
normal direction of blood flow towards the liver.

Other: None.
IMPRESSION: 1. Cholelithiasis. Gallbladder wall thickening probably due to under
distension. No other evidence of cholecystitis or biliary
obstruction.

## 2021-09-12 ENCOUNTER — Other Ambulatory Visit: Payer: Self-pay | Admitting: Family Medicine

## 2021-09-12 ENCOUNTER — Other Ambulatory Visit (HOSPITAL_COMMUNITY): Payer: Self-pay

## 2021-09-12 MED ORDER — ONDANSETRON 4 MG PO TBDP
ORAL_TABLET | ORAL | 1 refills | Status: DC
Start: 2021-09-12 — End: 2021-12-14
  Filled 2021-09-12: qty 60, 10d supply, fill #0
  Filled 2021-10-27: qty 60, 10d supply, fill #1

## 2021-09-14 IMAGING — US US ABDOMEN LIMITED
1 series · 14 of 25 positions shown · non-contrast
Comparison: Prior abdominal ultrasound 10/26/2019

CLINICAL DATA: 23-year-old female with epigastric and right upper
quadrant pain

EXAM:
ULTRASOUND ABDOMEN LIMITED RIGHT UPPER QUADRANT

[Series 1: us abdomen limited · 14 of 39 slices shown]
[im 1/39]
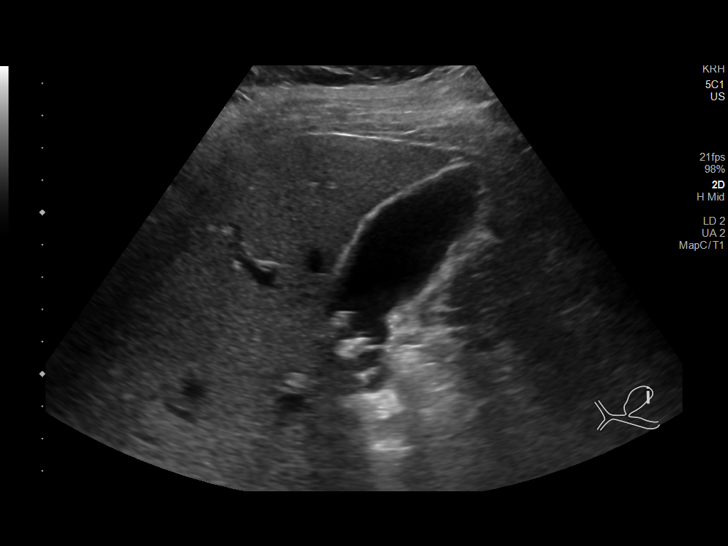
[im 4/39]
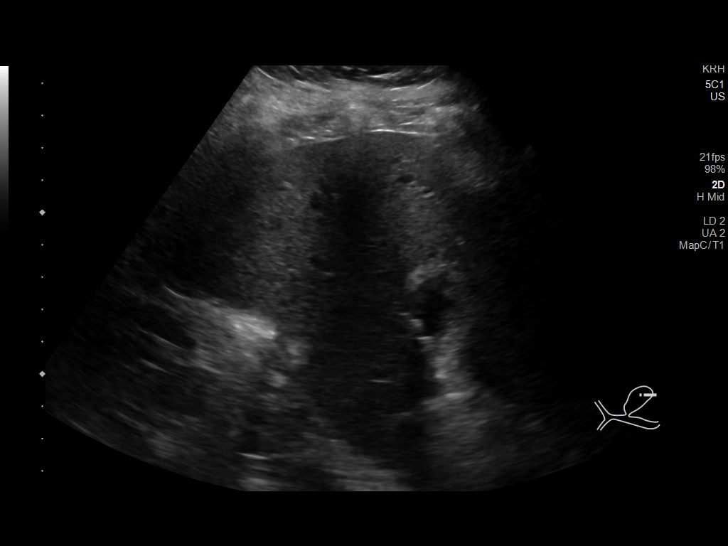
[im 7/39]
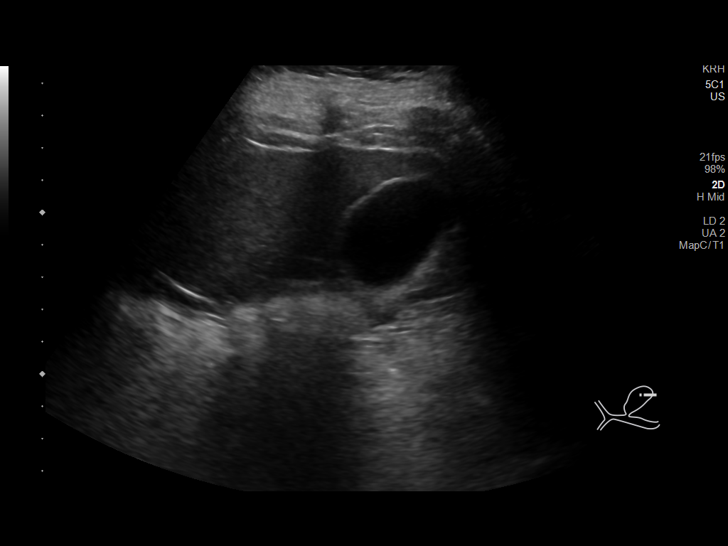
[im 10/39]
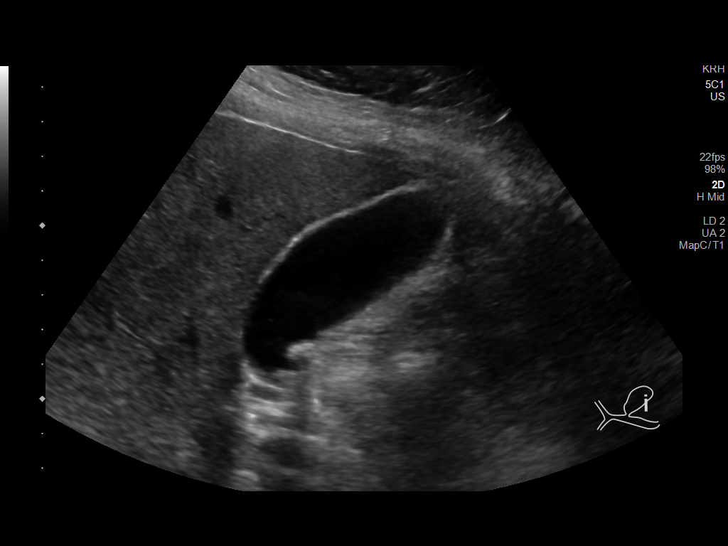
[im 13/39]
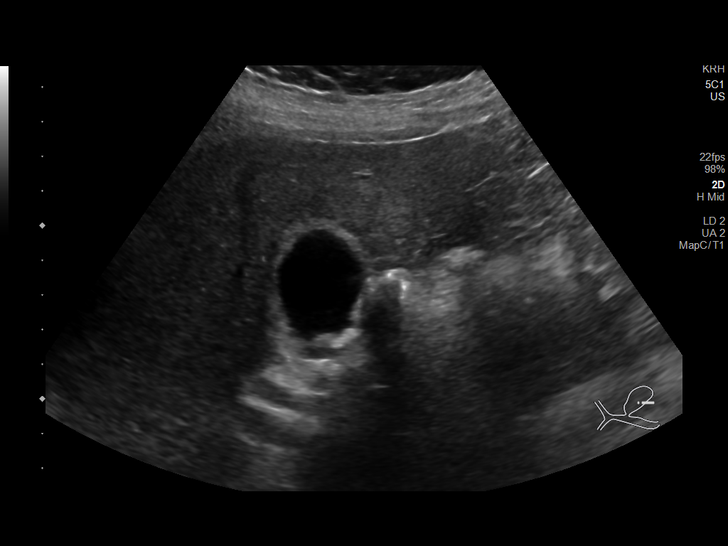
[im 15/39]
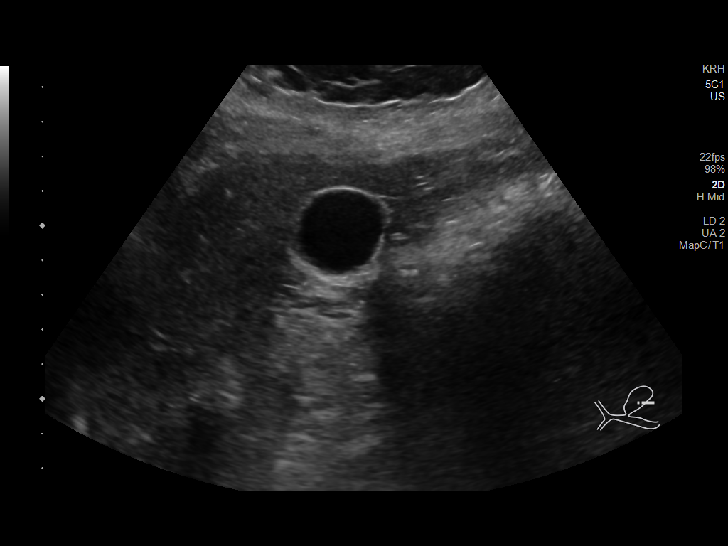
[im 18/39]
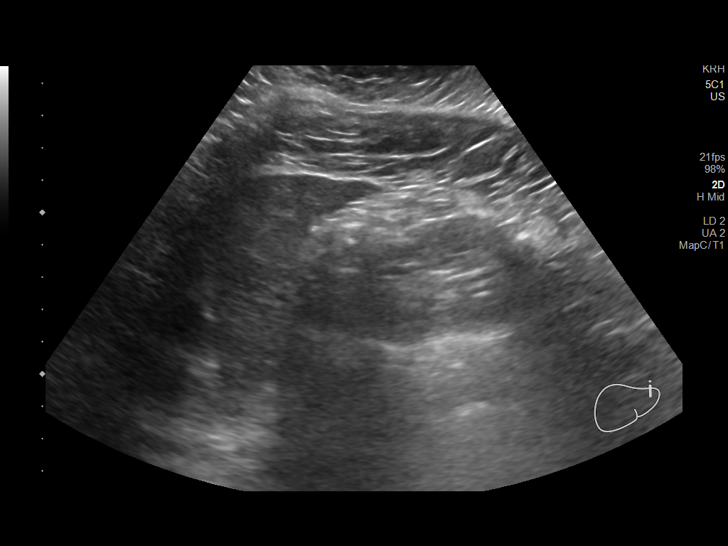
[im 21/39]
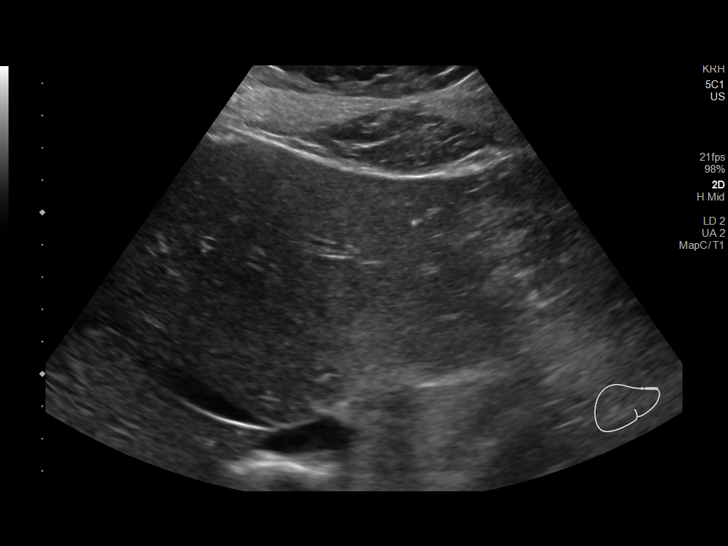
[im 24/39]
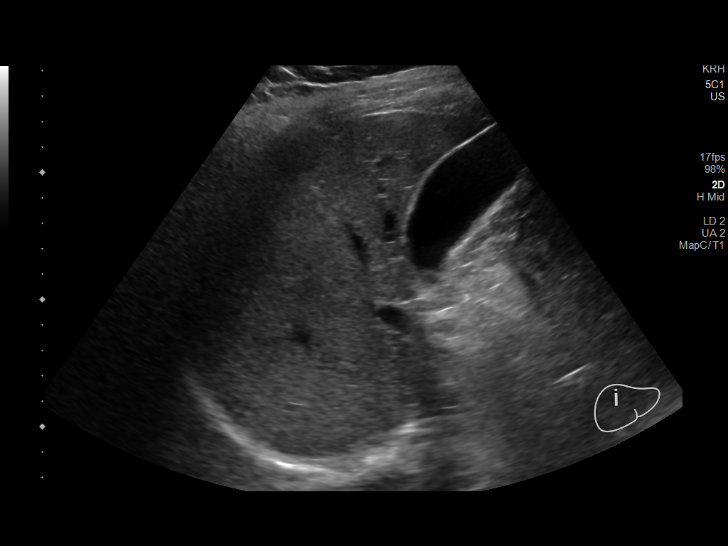
[im 26/39]
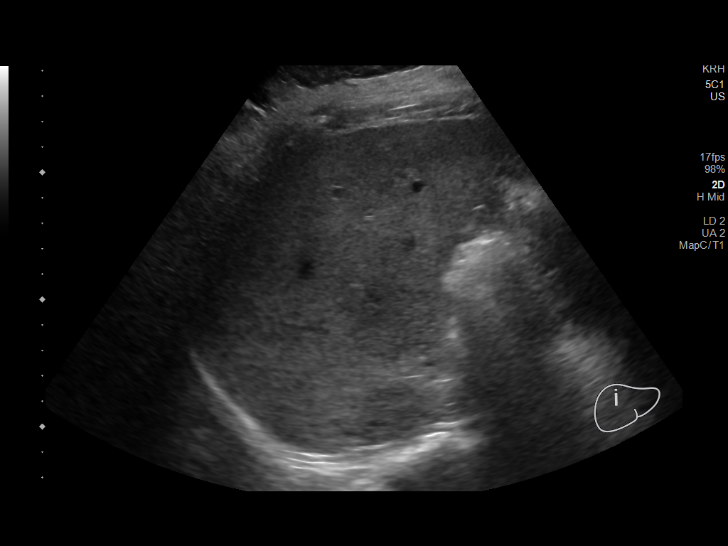
[im 29/39]
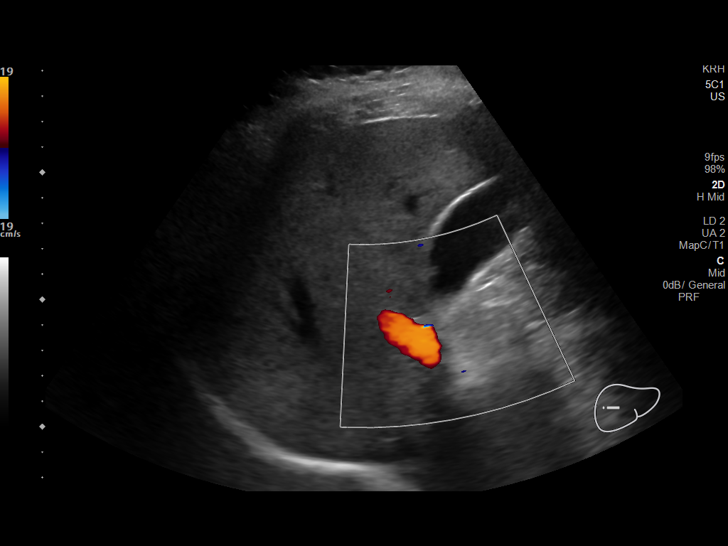
[im 32/39]
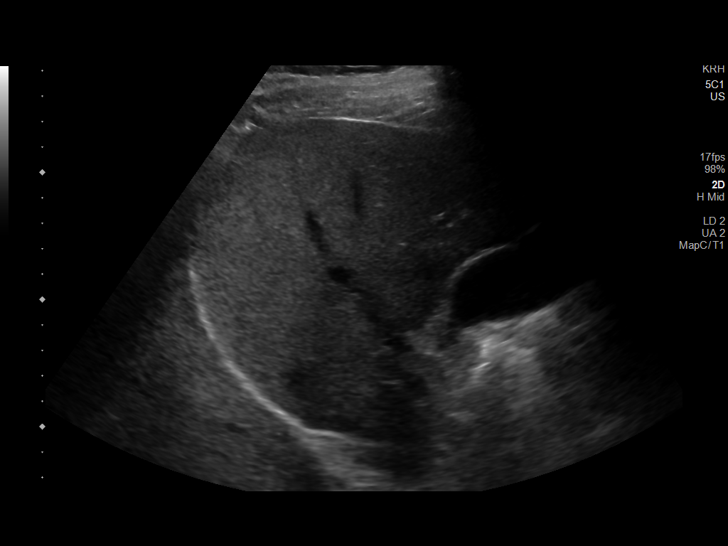
[im 35/39]
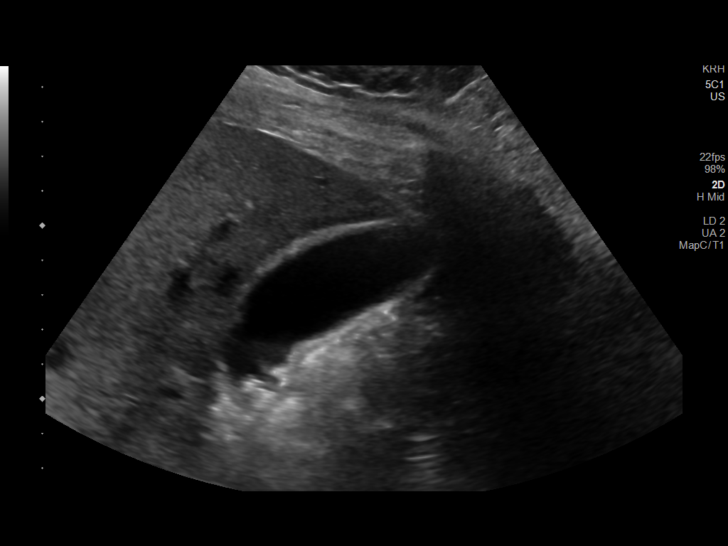
[im 39/39]
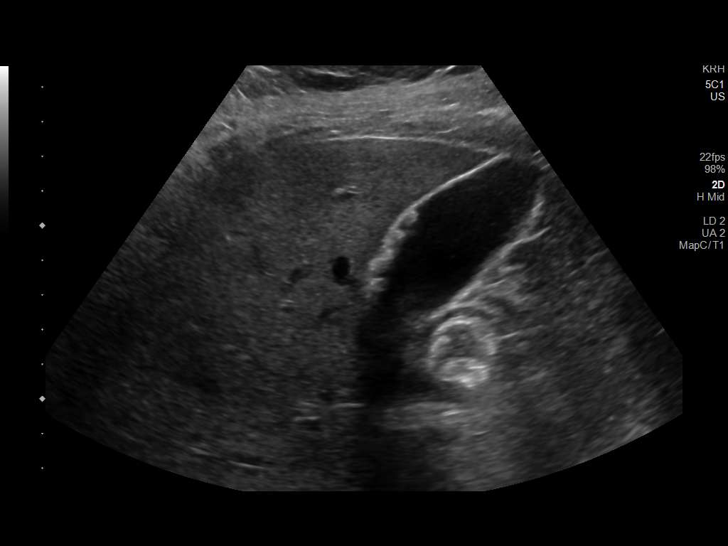

[14 of 25 positions shown; findings below may reference images not displayed]

FINDINGS: Gallbladder:

Mobile echogenic foci with posterior acoustic shadowing again noted
in the gallbladder consistent with cholelithiasis. The largest
individual stone measures up to 0.9 cm.

Common bile duct:

Diameter: 4 mm

Liver:

No focal lesion identified. Within normal limits in parenchymal
echogenicity. Portal vein is patent on color Doppler imaging with
normal direction of blood flow towards the liver.

Other: None.
IMPRESSION: Cholelithiasis without secondary sonographic findings to suggest
acute cholecystitis.

## 2021-09-15 ENCOUNTER — Other Ambulatory Visit (HOSPITAL_COMMUNITY): Payer: Self-pay

## 2021-10-12 ENCOUNTER — Other Ambulatory Visit: Payer: Self-pay | Admitting: Family Medicine

## 2021-10-13 ENCOUNTER — Other Ambulatory Visit (HOSPITAL_COMMUNITY): Payer: Self-pay

## 2021-10-13 MED ORDER — WEGOVY 1.7 MG/0.75ML ~~LOC~~ SOAJ
1.7000 mg | SUBCUTANEOUS | 0 refills | Status: DC
Start: 2021-10-13 — End: 2021-10-27
  Filled 2021-10-13: qty 3, 28d supply, fill #0

## 2021-10-13 NOTE — Telephone Encounter (Signed)
RF request for Wegovy LOV: 06/29/21 Next ov: 10/24/21 Last written: 07/28/21 (52mL,1)  Confirmed with pt, she is in need of the refill. 1 month supply sent to last until appt

## 2021-10-24 ENCOUNTER — Ambulatory Visit: Payer: No Typology Code available for payment source | Admitting: Family Medicine

## 2021-10-28 ENCOUNTER — Ambulatory Visit (INDEPENDENT_AMBULATORY_CARE_PROVIDER_SITE_OTHER): Payer: No Typology Code available for payment source | Admitting: Family Medicine

## 2021-10-28 ENCOUNTER — Encounter: Payer: Self-pay | Admitting: Family Medicine

## 2021-10-28 ENCOUNTER — Other Ambulatory Visit (HOSPITAL_COMMUNITY): Payer: Self-pay

## 2021-10-28 VITALS — BP 116/80 | HR 87 | Temp 98.6°F | Ht 63.0 in | Wt 170.4 lb

## 2021-10-28 DIAGNOSIS — E669 Obesity, unspecified: Secondary | ICD-10-CM | POA: Diagnosis not present

## 2021-10-28 DIAGNOSIS — R7303 Prediabetes: Secondary | ICD-10-CM

## 2021-10-28 DIAGNOSIS — F5101 Primary insomnia: Secondary | ICD-10-CM | POA: Diagnosis not present

## 2021-10-28 DIAGNOSIS — E785 Hyperlipidemia, unspecified: Secondary | ICD-10-CM

## 2021-10-28 MED ORDER — WEGOVY 1.7 MG/0.75ML ~~LOC~~ SOAJ
1.7000 mg | SUBCUTANEOUS | 2 refills | Status: DC
  Filled 2021-10-28 – 2021-11-10 (×2): qty 3, 28d supply, fill #0
  Filled 2021-12-05: qty 3, 28d supply, fill #1
  Filled 2022-01-06: qty 3, 28d supply, fill #2

## 2021-10-28 MED ORDER — TRIAZOLAM 0.25 MG PO TABS
ORAL_TABLET | ORAL | 0 refills | Status: DC
  Filled 2021-10-28: qty 30, 15d supply, fill #0

## 2021-10-28 NOTE — Progress Notes (Signed)
OFFICE VISIT  10/28/2021  CC:  Chief Complaint  Patient presents with   Weight Management    Pt states [name] is working well.     HPI:    Patient is a 26 y.o. female who presents for 3 mo f/u obesity with comorbidities->prediabetes and hyperlipidemia. A/P as of last visit: "#1 obesity class II, great improvement in weight loss efforts--has lost 35 pounds in the last 2-1/2 months with maximal diet, exercise, and use of Ozempic. In about a month when she is running out of Ozempic she will call us and we will do new prescription for [facility] and continue the 1 mg weekly dosing.   #2 hyperlipidemia--tolerating the relatively recent increase dose of atorvastatin to 40 mg a day. Lipid panel and hepatic panel today.  3.  Prediabetes.  Great diet and exercise, weight loss+ Ozempic as noted above #1. Hemoglobin A1c today."  INTERIM HX: Cholesterol and hemoglobin A1c levels improved last visit. Insurer requirements made her switch over to [facility] 1.7 mg weekly since last visit.  Brittany Cooper has continued to do very well with her diet and exercise.  [name] definitely causes early satiety.  Occasionally causes nausea to the point of needing Zofran but says this is not very often. Her weight is down 21 pounds in the last 4 months.  She brings up the fact today that she has had a long problem with having trouble getting down to sleep at night.  She started using Benadryl a couple years ago but has had a ramp up her dose and currently takes 125 mg of Benadryl to get her to initiate sleep.  Often is up till 2 to 3 AM trying to get to sleep.  Once she gets to sleep she wakes up pretty frequently to urinate but says she is able to return to sleep easily.  Upon awakening morning she feels "more tired than when I went to sleep".  There has been some suspicion that she has sleep apnea in the past. She does sometimes awaken abruptly with a gasp. We had planned on getting her sleep study at 1 point in time but she  chose not to get it.  ROS as above, plus--> no fevers, no CP, no SOB, no wheezing, no cough, no dizziness, no HAs, no rashes, no melena/hematochezia.  No polyuria or polydipsia.  No myalgias or arthralgias.  No focal weakness, paresthesias, or tremors.  No acute vision or hearing abnormalities.  No dysuria or unusual/new urinary urgency or frequency.  No recent changes in lower legs. No n/v/d or abd pain.  No palpitations.     Past Medical History:  Diagnosis Date   Frequent headaches    GAD (generalized anxiety disorder)    GERD (gastroesophageal reflux disease)    Gout 11/2016   History of cholecystitis 11/2019   with gallstones; cholecystectomy 11/06/19   Hyperlipemia, mixed    recommended statin 09/2019   Hypersomnolence 2019   Noct polysomn + multiple sleep latency testing planned as of Nov 2019 neuro eval.   Migraines    Obesity, Class II, BMI 35-39.9    PMDD (premenstrual dysphoric disorder) 11/2016   Postcholecystectomy syndrome    Prediabetes    Subclinical hypothyroidism 02/2018   + TPO ab mildly elevated-->evolving hashimoto's.  TSH back to normal 04/23/2018.    Past Surgical History:  Procedure Laterality Date   CHOLECYSTECTOMY N/A 11/06/2019   Procedure: LAPAROSCOPIC CHOLECYSTECTOMY WITH INDOCYANINE GREEN DYE;  Surgeon: Emelia Loron, MD;  Location: [facility];  Service: General;  Laterality: N/A;   TONSILLECTOMY     child    Outpatient Medications Prior to Visit  Medication Sig Dispense Refill   atorvastatin (LIPITOR) 40 MG tablet Take 1 tablet (40 mg total) by mouth daily. 90 tablet 1   norethindrone-ethinyl estradiol 1/35 (ORTHO-NOVUM) tablet Take 1 tablet by mouth at the same time every day. Take continuously skipping placebos as directed. 84 tablet 0   omeprazole (PRILOSEC) 40 MG capsule TAKE 1 CAPSULE (40 MG TOTAL) BY MOUTH IN THE MORNING AND 1 AT BEDTIME. 60 capsule 11   ondansetron (ZOFRAN-ODT) 4 MG disintegrating tablet DISSOLVE 1 - 2  TABLETS BY MOUTH 3 TIMES DAILY AS NEEDED FOR NAUSEA 60 tablet 1   sertraline (ZOLOFT) 100 MG tablet TAKE 1 TABLET BY MOUTH DAILY 90 tablet 1   Semaglutide-Weight Management (WEGOVY) 1.7 MG/0.75ML SOAJ Inject 1.7 mg into the skin every 7 (seven) days. 3 mL 0   No facility-administered medications prior to visit.    No Known Allergies  ROS As per HPI  PE:    10/28/2021    8:18 AM 06/29/2021    8:34 AM 05/31/2021    8:33 AM  Vitals with BMI  Height 5\' 3"  5\' 3"  5\' 3"   Weight 170 lbs 6 oz 191 lbs 10 oz 198 lbs 3 oz  BMI 30.19 33.95 35.12  Systolic 116 112  Diastolic 80 76 71  Pulse 87 85 88    Physical Exam  Gen: Alert, well appearing.  Patient is oriented to person, place, time, and situation.. AFFECT: pleasant, lucid thought and speech. No further exam today.  LABS:  Last CBC Lab Results  Component Value Date   WBC 10.5 03/09/2021   HGB 13.8 03/09/2021   HCT 41.2 03/09/2021   MCV 87.0 03/09/2021   MCH 30.0 11/01/2019   RDW 13.6 03/09/2021   PLT 381.0 03/09/2021   Last metabolic panel Lab Results  Component Value Date   GLUCOSE 78 06/29/2021   NA 137 06/29/2021   K 4.2 06/29/2021   CL 103 06/29/2021   CO2 26 06/29/2021   BUN 10 06/29/2021   CREATININE 0.89 06/29/2021   GFRNONAA >60 11/04/2019   CALCIUM 9.5 06/29/2021   PROT 7.5 06/29/2021   ALBUMIN 4.3 06/29/2021   BILITOT 0.4 06/29/2021   ALKPHOS 61 06/29/2021   AST 26 06/29/2021   ALT 45 (H) 06/29/2021   ANIONGAP 9 11/04/2019   Last lipids Lab Results  Component Value Date   CHOL 215 (H) 06/29/2021   HDL 55.70 06/29/2021   LDLCALC 136 (H) 06/29/2021   LDLDIRECT 117.0 08/02/2020   TRIG 114.0 06/29/2021   CHOLHDL 4 06/29/2021   Last hemoglobin A1c Lab Results  Component Value Date   HGBA1C 5.8 06/29/2021   Last thyroid functions Lab Results  Component Value Date   TSH 6.83 (H) 03/09/2021   T3TOTAL 142 03/09/2021    IMPRESSION AND PLAN:  #1 obesity, with comorbidities of  hyperlipidemia and prediabetes She has lost 56 pounds in the last 6 months (BMI from 35 down to 30) with great diet and exercise habits as well as semaglutide weekly.  We will keep her on the 1.7 mg Wegovy weekly at this time. We did discuss briefly the potential plan for either weaning down on this medicine or also the option of continuing indefinitely, particularly if at a lower dose Her goal weight is 150 pounds.  #2 insomnia, chronic. Excessive use of Benadryl. Discontinue Benadryl. Start triazolam 0.25 mg,  1-2 nightly as neede, #30. Therapeutic expectations and side effect profile of medication discussed today.  Patient's questions answered. Part of her excessive daytime sleepiness may indeed be due to obstructive sleep apnea. Will readdress the topic of sleep study again in the near future.  An After Visit Summary was printed and given to the patient.  FOLLOW UP: Return in about 2 weeks (around 11/11/2021) for f/u insomnia.  Signed:  Santiago Bumpers, MD           10/28/2021

## 2021-10-31 ENCOUNTER — Other Ambulatory Visit (HOSPITAL_COMMUNITY): Payer: Self-pay

## 2021-10-31 ENCOUNTER — Encounter: Payer: Self-pay | Admitting: Family Medicine

## 2021-10-31 MED ORDER — TEMAZEPAM 7.5 MG PO CAPS
ORAL_CAPSULE | ORAL | 0 refills | Status: DC
  Filled 2021-10-31: qty 30, 15d supply, fill #0

## 2021-10-31 NOTE — Telephone Encounter (Signed)
Okay, temazepam prescription sent.

## 2021-10-31 NOTE — Telephone Encounter (Signed)
Please review and advise.

## 2021-11-01 ENCOUNTER — Other Ambulatory Visit (HOSPITAL_COMMUNITY): Payer: Self-pay

## 2021-11-10 ENCOUNTER — Other Ambulatory Visit (HOSPITAL_COMMUNITY): Payer: Self-pay

## 2021-11-17 ENCOUNTER — Other Ambulatory Visit: Payer: Self-pay | Admitting: Family Medicine

## 2021-11-17 MED ORDER — TEMAZEPAM 7.5 MG PO CAPS
ORAL_CAPSULE | ORAL | 5 refills | Status: DC
  Filled 2021-11-17: qty 60, 30d supply, fill #0

## 2021-11-17 NOTE — Telephone Encounter (Signed)
Requesting: temazepam Contract: n/a UDS: n/a Last Visit: 10/28/21 Next Visit: 11/24/21 Last Refill: 10/31/21 (30,0)  Please Advise. Med pending. Pt requesting refill prior to 8/24 appt

## 2021-11-18 ENCOUNTER — Other Ambulatory Visit (HOSPITAL_COMMUNITY): Payer: Self-pay

## 2021-11-24 ENCOUNTER — Encounter: Payer: Self-pay | Admitting: Family Medicine

## 2021-11-24 ENCOUNTER — Other Ambulatory Visit (HOSPITAL_COMMUNITY): Payer: Self-pay

## 2021-11-24 ENCOUNTER — Ambulatory Visit (INDEPENDENT_AMBULATORY_CARE_PROVIDER_SITE_OTHER): Payer: No Typology Code available for payment source | Admitting: Family Medicine

## 2021-11-24 VITALS — BP 108/73 | HR 87 | Temp 98.5°F | Ht 63.0 in | Wt 166.6 lb

## 2021-11-24 DIAGNOSIS — F5101 Primary insomnia: Secondary | ICD-10-CM

## 2021-11-24 MED ORDER — TEMAZEPAM 15 MG PO CAPS
15.0000 mg | ORAL_CAPSULE | Freq: Every evening | ORAL | 1 refills | Status: DC | PRN
  Filled 2021-11-24 – 2021-12-14 (×2): qty 90, 90d supply, fill #0
  Filled 2022-03-08: qty 90, 90d supply, fill #1

## 2021-11-24 NOTE — Progress Notes (Signed)
OFFICE VISIT  11/24/2021  CC:  Chief Complaint  Patient presents with   Follow-up    Insomnia; patient reports medication is working well.   Patient is a 26 y.o. female who presents for 1 month follow-up insomnia. A/P as of last visit: "#1 obesity, with comorbidities of hyperlipidemia and prediabetes She has lost 56 pounds in the last 6 months (BMI from 35 down to 30) with great diet and exercise habits as well as semaglutide weekly.  We will keep her on the 1.7 mg Wegovy weekly at this time. We did discuss briefly the potential plan for either weaning down on this medicine or also the option of continuing indefinitely, particularly if at a lower dose Her goal weight is 150 pounds.   #2 insomnia, chronic. Excessive use of Benadryl. Discontinue Benadryl. Start triazolam 0.25 mg, 1-2 nightly as neede, #30. Therapeutic expectations and side effect profile of medication discussed today.  Patient's questions answered. Part of her excessive daytime sleepiness may indeed be due to obstructive sleep apnea. Will readdress the topic of sleep study again in the near future"  INTERIM HX: We ended up having to change triazolam to temazepam due to insurance requirements. Doing much better.  Did not seem to initiate sleep any better on the Restoril but when she takes 2 of the 7.5 mg tabs she does sleep more restfully, does not wake up nearly as frequently and when she does wake up she gets right back to sleep.  No morning hangover effect.  She has had excessive daytime sleepiness for years.  Few years ago she was seen by a sleep MD and was set up for a sleep study but she did not follow through with this test. She knows she needs to get this done.     PMP AWARE reviewed today: most recent rx for temazepam was filled 11/18/2021, #60, rx by me. No red flags.  Past Medical History:  Diagnosis Date   Frequent headaches    GAD (generalized anxiety disorder)    GERD (gastroesophageal reflux disease)     Gout 11/2016   History of cholecystitis 11/2019   with gallstones; cholecystectomy 11/06/19   Hyperlipemia, mixed    recommended statin 09/2019   Hypersomnolence 2019   Noct polysomn + multiple sleep latency testing planned as of Nov 2019 neuro eval.   Migraines    Obesity, Class II, BMI 35-39.9    PMDD (premenstrual dysphoric disorder) 11/2016   Postcholecystectomy syndrome    Prediabetes    Subclinical hypothyroidism 02/2018   + TPO ab mildly elevated-->evolving hashimoto's.  TSH back to normal 04/23/2018.    Past Surgical History:  Procedure Laterality Date   CHOLECYSTECTOMY N/A 11/06/2019   Procedure: LAPAROSCOPIC CHOLECYSTECTOMY WITH INDOCYANINE GREEN DYE;  Surgeon: Emelia Loron, MD;  Location: [facility];  Service: General;  Laterality: N/A;   TONSILLECTOMY     child    Outpatient Medications Prior to Visit  Medication Sig Dispense Refill   atorvastatin (LIPITOR) 40 MG tablet Take 1 tablet (40 mg total) by mouth daily. 90 tablet 1   norethindrone-ethinyl estradiol 1/35 (ORTHO-NOVUM) tablet Take 1 tablet by mouth at the same time every day. Take continuously skipping placebos as directed. 84 tablet 0   omeprazole (PRILOSEC) 40 MG capsule TAKE 1 CAPSULE (40 MG TOTAL) BY MOUTH IN THE MORNING AND 1 AT BEDTIME. 60 capsule 11   ondansetron (ZOFRAN-ODT) 4 MG disintegrating tablet DISSOLVE 1 - 2 TABLETS BY MOUTH 3 TIMES DAILY AS NEEDED FOR  NAUSEA 60 tablet 1   Semaglutide-Weight Management (WEGOVY) 1.7 MG/0.75ML SOAJ Inject 1.7 mg into the skin every 7 (seven) days. 3 mL 2   sertraline (ZOLOFT) 100 MG tablet TAKE 1 TABLET BY MOUTH DAILY 90 tablet 1   temazepam (RESTORIL) 7.5 MG capsule Take 1 to 2 capsules by mouth nightly as needed insomnia. 60 capsule 5   No facility-administered medications prior to visit.    No Known Allergies  ROS As per HPI  PE:    11/24/2021   10:56 AM 10/28/2021    8:18 AM 06/29/2021    8:34 AM  Vitals with BMI  Height 5\' 3"  5'  3" 5\' 3"   Weight 166 lbs 10 oz 170 lbs 6 oz 191 lbs 10 oz  BMI 29.52 30.19 33.95  Systolic 108 116  Diastolic 73 80 76  Pulse 87 87 85    Physical Exam  Gen: Alert, well appearing.  Patient is oriented to person, place, time, and situation.. AFFECT: pleasant, lucid thought and speech. Further exam today.  LABS:  Last CBC Lab Results  Component Value Date   WBC 10.5 03/09/2021   HGB 13.8 03/09/2021   HCT 41.2 03/09/2021   MCV 87.0 03/09/2021   MCH 30.0 11/01/2019   RDW 13.6 03/09/2021   PLT 381.0 03/09/2021   Last metabolic panel Lab Results  Component Value Date   GLUCOSE 78 06/29/2021   NA 137 06/29/2021   K 4.2 06/29/2021   CL 103 06/29/2021   CO2 26 06/29/2021   BUN 10 06/29/2021   CREATININE 0.89 06/29/2021   GFRNONAA >60 11/04/2019   CALCIUM 9.5 06/29/2021   PROT 7.5 06/29/2021   ALBUMIN 4.3 06/29/2021   BILITOT 0.4 06/29/2021   ALKPHOS 61 06/29/2021   AST 26 06/29/2021   ALT 45 (H) 06/29/2021   ANIONGAP 9 11/04/2019   Last lipids Lab Results  Component Value Date   CHOL 215 (H) 06/29/2021   HDL 55.70 06/29/2021   LDLCALC 136 (H) 06/29/2021   LDLDIRECT 117.0 08/02/2020   TRIG 114.0 06/29/2021   CHOLHDL 4 06/29/2021   Last hemoglobin A1c Lab Results  Component Value Date   HGBA1C 5.8 06/29/2021   Lab Results  Component Value Date   TSH 6.83 (H) 03/09/2021   IMPRESSION AND PLAN:  #1 primary insomnia. Improved with temazepam 15 mg a day. Continue 15 mg temazepam, 1 tab 2 at bedtime as needed, #90, refill x1. Controlled substance contract initiated today.  #2 excessive daytime sleepiness. Needs to rule out obstructive sleep apnea.  This process was started a few years ago and patient will contact her sleep MD to reestablish.  #3 obesity. She continues to do well on Wegovy at the 1.7 milligram weekly dosing. Continue this.  An After Visit Summary was printed and given to the patient.  FOLLOW UP: No follow-ups on file.  Signed:   07/01/2021, MD           11/24/2021

## 2021-11-30 ENCOUNTER — Encounter: Payer: Self-pay | Admitting: Family Medicine

## 2021-11-30 ENCOUNTER — Ambulatory Visit (INDEPENDENT_AMBULATORY_CARE_PROVIDER_SITE_OTHER): Payer: No Typology Code available for payment source | Admitting: Family Medicine

## 2021-11-30 VITALS — BP 112/77 | HR 88 | Temp 98.7°F | Ht 63.75 in | Wt 166.4 lb

## 2021-11-30 DIAGNOSIS — R7303 Prediabetes: Secondary | ICD-10-CM

## 2021-11-30 DIAGNOSIS — E785 Hyperlipidemia, unspecified: Secondary | ICD-10-CM

## 2021-11-30 DIAGNOSIS — Z0000 Encounter for general adult medical examination without abnormal findings: Secondary | ICD-10-CM

## 2021-11-30 DIAGNOSIS — R7401 Elevation of levels of liver transaminase levels: Secondary | ICD-10-CM

## 2021-11-30 DIAGNOSIS — E7800 Pure hypercholesterolemia, unspecified: Secondary | ICD-10-CM

## 2021-11-30 DIAGNOSIS — E038 Other specified hypothyroidism: Secondary | ICD-10-CM | POA: Diagnosis not present

## 2021-11-30 NOTE — Patient Instructions (Addendum)
Please schedule a follow up with Dr.Wein for repeat pap smear.   Health Maintenance, Female Adopting a healthy lifestyle and getting preventive care are important in promoting health and wellness. Ask your health care provider about: The right schedule for you to have regular tests and exams. Things you can do on your own to prevent diseases and keep yourself healthy. What should I know about diet, weight, and exercise? Eat a healthy diet  Eat a diet that includes plenty of vegetables, fruits, low-fat dairy products, and lean protein. Do not eat a lot of foods that are high in solid fats, added sugars, or sodium. Maintain a healthy weight Body mass index (BMI) is used to identify weight problems. It estimates body fat based on height and weight. Your health care provider can help determine your BMI and help you achieve or maintain a healthy weight. Get regular exercise Get regular exercise. This is one of the most important things you can do for your health. Most adults should: Exercise for at least 150 minutes each week. The exercise should increase your heart rate and make you sweat (moderate-intensity exercise). Do strengthening exercises at least twice a week. This is in addition to the moderate-intensity exercise. Spend less time sitting. Even light physical activity can be beneficial. Watch cholesterol and blood lipids Have your blood tested for lipids and cholesterol at 28 years of age, then have this test every 5 years. Have your cholesterol levels checked more often if: Your lipid or cholesterol levels are high. You are older than 28 years of age. You are at high risk for heart disease. What should I know about cancer screening? Depending on your health history and family history, you may need to have cancer screening at various ages. This may include screening for: Breast cancer. Cervical cancer. Colorectal cancer. Skin cancer. Lung cancer. What should I know about heart  disease, diabetes, and high blood pressure? Blood pressure and heart disease High blood pressure causes heart disease and increases the risk of stroke. This is more likely to develop in people who have high blood pressure readings or are overweight. Have your blood pressure checked: Every 3-5 years if you are 82-25 years of age. Every year if you are 50 years old or older. Diabetes Have regular diabetes screenings. This checks your fasting blood sugar level. Have the screening done: Once every three years after age 15 if you are at a normal weight and have a low risk for diabetes. More often and at a younger age if you are overweight or have a high risk for diabetes. What should I know about preventing infection? Hepatitis B If you have a higher risk for hepatitis B, you should be screened for this virus. Talk with your health care provider to find out if you are at risk for hepatitis B infection. Hepatitis C Testing is recommended for: Everyone born from [birth year] through 1967. Anyone with known risk factors for hepatitis C. Sexually transmitted infections (STIs) Get screened for STIs, including gonorrhea and chlamydia, if: You are sexually active and are younger than 28 years of age. You are older than 28 years of age and your health care provider tells you that you are at risk for this type of infection. Your sexual activity has changed since you were last screened, and you are at increased risk for chlamydia or gonorrhea. Ask your health care provider if you are at risk. Ask your health care provider about whether you are at high risk for HIV.  Your health care provider may recommend a prescription medicine to help prevent HIV infection. If you choose to take medicine to prevent HIV, you should first get tested for HIV. You should then be tested every 3 months for as long as you are taking the medicine. Pregnancy If you are about to stop having your period (premenopausal) and you may become  pregnant, seek counseling before you get pregnant. Take 400 to 800 micrograms (mcg) of folic acid every day if you become pregnant. Ask for birth control (contraception) if you want to prevent pregnancy. Osteoporosis and menopause Osteoporosis is a disease in which the bones lose minerals and strength with aging. This can result in bone fractures. If you are 82 years old or older, or if you are at risk for osteoporosis and fractures, ask your health care provider if you should: Be screened for bone loss. Take a calcium or vitamin D supplement to lower your risk of fractures. Be given hormone replacement therapy (HRT) to treat symptoms of menopause. Follow these instructions at home: Alcohol use Do not drink alcohol if: Your health care provider tells you not to drink. You are pregnant, may be pregnant, or are planning to become pregnant. If you drink alcohol: Limit how much you have to: 0-1 drink a day. Know how much alcohol is in your drink. In the [place] one drink equals one 12 oz bottle of beer (355 mL), one 5 oz glass of wine (148 mL), or one 1 oz glass of hard liquor (44 mL). Lifestyle Do not use any products that contain nicotine or tobacco. These products include cigarettes, chewing tobacco, and vaping devices, such as e-cigarettes. If you need help quitting, ask your health care provider. Do not use street drugs. Do not share needles. Ask your health care provider for help if you need support or information about quitting drugs. General instructions Schedule regular health, dental, and eye exams. Stay current with your vaccines. Tell your health care provider if: You often feel depressed. You have ever been abused or do not feel safe at home. Summary Adopting a healthy lifestyle and getting preventive care are important in promoting health and wellness. Follow your health care provider's instructions about healthy diet, exercising, and getting tested or screened for  diseases. Follow your health care provider's instructions on monitoring your cholesterol and blood pressure. This information is not intended to replace advice given to you by your health care provider. Make sure you discuss any questions you have with your health care provider. Document Revised: 08/09/2020 Document Reviewed: 08/09/2020 Elsevier Patient Education  [place].

## 2021-11-30 NOTE — Progress Notes (Signed)
Office Note 11/30/2021  CC:  Chief Complaint  Patient presents with   Annual Exam    Pt is not fasting    HPI:  Patient is a 26 y.o. female who is here for annual health maintenance exam.  Feeling well.  Continues to eat healthy and exercise. No problems with meds.  Past Medical History:  Diagnosis Date   Frequent headaches    GAD (generalized anxiety disorder)    GERD (gastroesophageal reflux disease)    Gout 11/2016   History of cholecystitis 11/2019   with gallstones; cholecystectomy 11/06/19   Hyperlipemia, mixed    recommended statin 09/2019   Hypersomnolence 2019   Noct polysomn + multiple sleep latency testing planned as of Nov 2019 neuro eval.   Migraines    Obesity, Class II, BMI 35-39.9    PMDD (premenstrual dysphoric disorder) 11/2016   Postcholecystectomy syndrome    Prediabetes    Subclinical hypothyroidism 02/2018   + TPO ab mildly elevated-->evolving hashimoto's.  TSH back to normal 04/23/2018.    Past Surgical History:  Procedure Laterality Date   CHOLECYSTECTOMY N/A 11/06/2019   Procedure: LAPAROSCOPIC CHOLECYSTECTOMY WITH INDOCYANINE GREEN DYE;  Surgeon: Rolm Bookbinder, MD;  Location: [facility];  Service: General;  Laterality: N/A;   TONSILLECTOMY     child    Family History  Problem Relation Age of Onset   Hyperlipidemia Father    Heart disease Father    Hypertension Father    Diabetes Father    Heart attack Father 22   Arthritis Maternal Grandmother    Breast cancer Maternal Grandmother    Hyperlipidemia Maternal Grandmother    Heart disease Maternal Grandmother    Hypertension Maternal Grandmother    Diabetes Maternal Grandmother    Multiple myeloma Maternal Grandfather    Arthritis Paternal Grandmother    Liver disease Paternal Grandfather    Alcohol abuse Paternal Grandfather     Social History   Socioeconomic History   Marital status: Single    Spouse name: Not on file   Number of children: Not on file    Years of education: Not on file   Highest education level: Bachelor's degree (e.g., BA, AB, BS)  Occupational History   Not on file  Tobacco Use   Smoking status: Never   Smokeless tobacco: Never  Vaping Use   Vaping Use: Never used  Substance and Sexual Activity   Alcohol use: No   Drug use: No   Sexual activity: Not on file  Other Topics Concern   Not on file  Social History Narrative   Divorced 2020, no children.   Educ: BA at [employer].   Working for [employer] as of 09/2019.   No tob.   Rare alc.   No drugs.   Social Determinants of Health   Financial Resource Strain: Low Risk  (04/15/2021)   Overall Financial Resource Strain (CARDIA)    Difficulty of Paying Living Expenses: Not hard at all  Food Insecurity: No Food Insecurity (04/15/2021)   Hunger Vital Sign    Worried About Running Out of Food in the Last Year: Never true    Ran Out of Food in the Last Year: Never true  Transportation Needs: No Transportation Needs (04/15/2021)   PRAPARE - Hydrologist (Medical): No    Lack of Transportation (Non-Medical): No  Physical Activity: Insufficiently Active (04/15/2021)   Exercise Vital Sign    Days of Exercise per Week: 3 days  Minutes of Exercise per Session: 30 min  Stress: No Stress Concern Present (04/15/2021)   [place]    Feeling of Stress : Not at all  Social Connections: Moderately Integrated (04/15/2021)   Social Connection and Isolation Panel [NHANES]    Frequency of Communication with Friends and Family: Twice a week    Frequency of Social Gatherings with Friends and Family: More than three times a week    Attends Religious Services: More than 4 times per year    Active Member of [employer] or Organizations: Yes    Attends Archivist Meetings: 1 to 4 times per year    Marital Status: Never married  Human resources officer Violence: Not on file    Outpatient Medications Prior  to Visit  Medication Sig Dispense Refill   atorvastatin (LIPITOR) 40 MG tablet Take 1 tablet (40 mg total) by mouth daily. 90 tablet 1   norethindrone-ethinyl estradiol 1/35 (ORTHO-NOVUM) tablet Take 1 tablet by mouth at the same time every day. Take continuously skipping placebos as directed. 84 tablet 0   omeprazole (PRILOSEC) 40 MG capsule TAKE 1 CAPSULE (40 MG TOTAL) BY MOUTH IN THE MORNING AND 1 AT BEDTIME. 60 capsule 11   ondansetron (ZOFRAN-ODT) 4 MG disintegrating tablet DISSOLVE 1 - 2 TABLETS BY MOUTH 3 TIMES DAILY AS NEEDED FOR NAUSEA 60 tablet 1   Semaglutide-Weight Management (WEGOVY) 1.7 MG/0.75ML SOAJ Inject 1.7 mg into the skin every 7 (seven) days. 3 mL 2   sertraline (ZOLOFT) 100 MG tablet TAKE 1 TABLET BY MOUTH DAILY 90 tablet 1   temazepam (RESTORIL) 15 MG capsule Take 1 capsule by mouth at bedtime as needed for sleep. 90 capsule 1   No facility-administered medications prior to visit.    No Known Allergies  ROS Review of Systems  Constitutional:  Negative for appetite change, chills, fatigue and fever.  HENT:  Negative for congestion, dental problem, ear pain and sore throat.   Eyes:  Negative for discharge, redness and visual disturbance.  Respiratory:  Negative for cough, chest tightness, shortness of breath and wheezing.   Cardiovascular:  Negative for chest pain, palpitations and leg swelling.  Gastrointestinal:  Negative for abdominal pain, blood in stool, diarrhea, nausea and vomiting.  Genitourinary:  Negative for difficulty urinating, dysuria, flank pain, frequency, hematuria and urgency.  Musculoskeletal:  Negative for arthralgias, back pain, joint swelling, myalgias and neck stiffness.  Skin:  Negative for pallor and rash.  Neurological:  Negative for dizziness, speech difficulty, weakness and headaches.  Hematological:  Negative for adenopathy. Does not bruise/bleed easily.  Psychiatric/Behavioral:  Negative for confusion and sleep disturbance. The patient  is not nervous/anxious.     PE;    11/30/2021    1:40 PM 11/24/2021   10:56 AM 10/28/2021    8:18 AM  Vitals with BMI  Height 5' 3.75" 5' 3" 5' 3"  Weight 166 lbs 6 oz 166 lbs 10 oz 170 lbs 6 oz  BMI 28.8 49.70 26.37  Systolic 858 850 277  Diastolic 77 73 80  Pulse 88 87 87     Exam chaperoned by Deveron Furlong, CMA. Gen: Alert, well appearing.  Patient is oriented to person, place, time, and situation. AFFECT: pleasant, lucid thought and speech. ENT: Ears: EACs clear, normal epithelium.  TMs with good light reflex and landmarks bilaterally.  Eyes: no injection, icteris, swelling, or exudate.  EOMI, PERRLA. Nose: no drainage or turbinate edema/swelling.  No injection or focal  lesion.  Mouth: lips without lesion/swelling.  Oral mucosa pink and moist.  Dentition intact and without obvious caries or gingival swelling.  Oropharynx without erythema, exudate, or swelling.  Neck: supple/nontender.  No LAD, mass, or TM.  Carotid pulses 2+ bilaterally, without bruits. CV: RRR, no m/r/g.   LUNGS: CTA bilat, nonlabored resps, good aeration in all lung fields. ABD: soft, NT, ND, BS normal.  No hepatospenomegaly or mass.  No bruits. EXT: no clubbing, cyanosis, or edema.  Musculoskeletal: no joint swelling, erythema, warmth, or tenderness.  ROM of all joints intact. Skin - no sores or suspicious lesions or rashes or color changes  Pertinent labs:  Lab Results  Component Value Date   TSH 6.83 (H) 03/09/2021   Lab Results  Component Value Date   WBC 10.5 03/09/2021   HGB 13.8 03/09/2021   HCT 41.2 03/09/2021   MCV 87.0 03/09/2021   PLT 381.0 03/09/2021   Lab Results  Component Value Date   CREATININE 0.89 06/29/2021   BUN 10 06/29/2021   NA 137 06/29/2021   K 4.2 06/29/2021   CL 103 06/29/2021   CO2 26 06/29/2021   Lab Results  Component Value Date   ALT 45 (H) 06/29/2021   AST 26 06/29/2021   ALKPHOS 61 06/29/2021   BILITOT 0.4 06/29/2021   Lab Results  Component Value  Date   CHOL 215 (H) 06/29/2021   Lab Results  Component Value Date   HDL 55.70 06/29/2021   Lab Results  Component Value Date   LDLCALC 136 (H) 06/29/2021   Lab Results  Component Value Date   TRIG 114.0 06/29/2021   Lab Results  Component Value Date   CHOLHDL 4 06/29/2021   Lab Results  Component Value Date   HGBA1C 5.8 06/29/2021   ASSESSMENT AND PLAN:   Health maintenance exam: Reviewed age and gender appropriate health maintenance issues (prudent diet, regular exercise, health risks of tobacco and excessive alcohol, use of seatbelts, fire alarms in home, use of sunscreen).  Also reviewed age and gender appropriate health screening as well as vaccine recommendations. Vaccines: utd Labs: future HP labs ordered.  She will likely wait until next f/u with me in 76moto get these. Cervical ca screening: pt to make appt with her GYN MD.   An After Visit Summary was printed and given to the patient.  FOLLOW UP:  Return in about 6 months (around 06/01/2022) for routine chronic illness f/u.  Signed:  PCrissie Sickles MD           11/30/2021

## 2021-12-05 ENCOUNTER — Other Ambulatory Visit (HOSPITAL_COMMUNITY): Payer: Self-pay

## 2021-12-05 ENCOUNTER — Other Ambulatory Visit: Payer: Self-pay | Admitting: Family Medicine

## 2021-12-06 ENCOUNTER — Other Ambulatory Visit (HOSPITAL_COMMUNITY): Payer: Self-pay

## 2021-12-06 MED ORDER — ATORVASTATIN CALCIUM 40 MG PO TABS
40.0000 mg | ORAL_TABLET | Freq: Every day | ORAL | 1 refills | Status: DC
  Filled 2021-12-06: qty 90, 90d supply, fill #0
  Filled 2022-03-08: qty 90, 90d supply, fill #1

## 2021-12-14 ENCOUNTER — Other Ambulatory Visit: Payer: Self-pay | Admitting: Family Medicine

## 2021-12-15 ENCOUNTER — Other Ambulatory Visit (HOSPITAL_COMMUNITY): Payer: Self-pay

## 2021-12-15 MED ORDER — ONDANSETRON 4 MG PO TBDP
2.0000 mg | ORAL_TABLET | Freq: Three times a day (TID) | ORAL | 1 refills | Status: DC
  Filled 2021-12-15: qty 60, 20d supply, fill #0
  Filled 2022-01-16: qty 60, 20d supply, fill #1

## 2022-01-06 ENCOUNTER — Other Ambulatory Visit (HOSPITAL_COMMUNITY): Payer: Self-pay

## 2022-01-16 ENCOUNTER — Other Ambulatory Visit (HOSPITAL_COMMUNITY): Payer: Self-pay

## 2022-01-16 ENCOUNTER — Other Ambulatory Visit: Payer: Self-pay | Admitting: Family Medicine

## 2022-01-16 MED ORDER — SERTRALINE HCL 100 MG PO TABS
100.0000 mg | ORAL_TABLET | Freq: Every day | ORAL | 1 refills | Status: DC
  Filled 2022-01-16: qty 90, 90d supply, fill #0
  Filled 2022-04-19: qty 90, 90d supply, fill #1

## 2022-02-05 ENCOUNTER — Other Ambulatory Visit: Payer: Self-pay | Admitting: Family Medicine

## 2022-02-06 ENCOUNTER — Other Ambulatory Visit (HOSPITAL_COMMUNITY): Payer: Self-pay

## 2022-02-06 MED ORDER — WEGOVY 1.7 MG/0.75ML ~~LOC~~ SOAJ
1.7000 mg | SUBCUTANEOUS | 1 refills | Status: DC
  Filled 2022-02-06: qty 3, 28d supply, fill #0
  Filled 2022-03-07: qty 3, 28d supply, fill #1

## 2022-03-07 ENCOUNTER — Other Ambulatory Visit (HOSPITAL_COMMUNITY): Payer: Self-pay

## 2022-03-08 ENCOUNTER — Other Ambulatory Visit: Payer: Self-pay | Admitting: Family Medicine

## 2022-03-09 ENCOUNTER — Other Ambulatory Visit (HOSPITAL_COMMUNITY): Payer: Self-pay

## 2022-03-09 MED ORDER — ONDANSETRON 4 MG PO TBDP
2.0000 mg | ORAL_TABLET | Freq: Three times a day (TID) | ORAL | 1 refills | Status: DC
  Filled 2022-03-09: qty 60, 20d supply, fill #0
  Filled 2022-05-07: qty 60, 20d supply, fill #1

## 2022-03-11 ENCOUNTER — Other Ambulatory Visit (HOSPITAL_COMMUNITY): Payer: Self-pay

## 2022-03-12 ENCOUNTER — Encounter: Payer: Self-pay | Admitting: Family Medicine

## 2022-03-13 ENCOUNTER — Telehealth: Payer: No Typology Code available for payment source | Admitting: Physician Assistant

## 2022-03-13 DIAGNOSIS — L089 Local infection of the skin and subcutaneous tissue, unspecified: Secondary | ICD-10-CM

## 2022-03-13 DIAGNOSIS — S31135A Puncture wound of abdominal wall without foreign body, periumbilic region without penetration into peritoneal cavity, initial encounter: Secondary | ICD-10-CM

## 2022-03-13 MED ORDER — CEPHALEXIN 500 MG PO CAPS: 500.0000 mg | ORAL_CAPSULE | Freq: Four times a day (QID) | ORAL | 0 refills | Status: DC

## 2022-03-13 NOTE — Progress Notes (Signed)
E Visit for Cellulitis  We are sorry that you are not feeling well. Here is how we plan to help!  Based on what you shared with me it looks like you have cellulitis.  Cellulitis looks like areas of skin redness, swelling, and warmth; it develops as a result of bacteria entering under the skin. Little red spots and/or bleeding can be seen in skin, and tiny surface sacs containing fluid can occur. Fever can be present. Cellulitis is almost always on one side of a body, and the lower limbs are the most common site of involvement.   I have prescribed:  Keflex 500mg take one by mouth four times a day for 5 days  HOME CARE:  Take your medications as ordered and take all of them, even if the skin irritation appears to be healing.   GET HELP RIGHT AWAY IF:  Symptoms that don't begin to go away within 48 hours. Severe redness persists or worsens If the area turns color, spreads or swells. If it blisters and opens, develops yellow-brown crust or bleeds. You develop a fever or chills. If the pain increases or becomes unbearable.  Are unable to keep fluids and food down.  MAKE SURE YOU   Understand these instructions. Will watch your condition. Will get help right away if you are not doing well or get worse.  Thank you for choosing an e-visit.  Your e-visit answers were reviewed by a board certified advanced clinical practitioner to complete your personal care plan. Depending upon the condition, your plan could have included both over the counter or prescription medications.  Please review your pharmacy choice. Make sure the pharmacy is open so you can pick up prescription now. If there is a problem, you may contact your provider through MyChart messaging and have the prescription routed to another pharmacy.  Your safety is important to us. If you have drug allergies check your prescription carefully.   For the next 24 hours you can use MyChart to ask questions about today's visit, request a  non-urgent call back, or ask for a work or school excuse. You will get an email in the next two days asking about your experience. I hope that your e-visit has been valuable and will speed your recovery.  I have spent 5 minutes in review of e-visit questionnaire, review and updating patient chart, medical decision making and response to patient.   Precilla Purnell M Novali Vollman, PA-C  

## 2022-03-13 NOTE — Telephone Encounter (Signed)
Brittany Cooper (Key: [id number]) [name] 1.7MG /0.75ML auto-injectors   Form MedImpact ePA Form 2017 NCPDP

## 2022-03-14 ENCOUNTER — Other Ambulatory Visit (HOSPITAL_COMMUNITY): Payer: Self-pay

## 2022-03-15 MED ORDER — WEGOVY 2.4 MG/0.75ML ~~LOC~~ SOAJ
2.4000 mg | SUBCUTANEOUS | 3 refills | Status: DC
  Filled 2022-03-15: qty 3, 28d supply, fill #0
  Filled 2022-04-10: qty 3, 28d supply, fill #1
  Filled 2022-05-07: qty 3, 28d supply, fill #2
  Filled 2022-05-31: qty 3, 28d supply, fill #3

## 2022-03-15 NOTE — Telephone Encounter (Signed)
Wegovy 2.4 mg sent.

## 2022-03-16 ENCOUNTER — Other Ambulatory Visit (HOSPITAL_COMMUNITY): Payer: Self-pay

## 2022-03-16 ENCOUNTER — Other Ambulatory Visit: Payer: Self-pay

## 2022-03-17 ENCOUNTER — Other Ambulatory Visit (HOSPITAL_COMMUNITY): Payer: Self-pay

## 2022-03-22 ENCOUNTER — Other Ambulatory Visit (HOSPITAL_COMMUNITY): Payer: Self-pay

## 2022-03-27 ENCOUNTER — Telehealth: Payer: No Typology Code available for payment source | Admitting: Physician Assistant

## 2022-03-27 DIAGNOSIS — M545 Low back pain, unspecified: Secondary | ICD-10-CM

## 2022-03-28 MED ORDER — CYCLOBENZAPRINE HCL 10 MG PO TABS: 10.0000 mg | ORAL_TABLET | Freq: Three times a day (TID) | ORAL | 0 refills | Status: DC | PRN

## 2022-03-28 NOTE — Progress Notes (Signed)

## 2022-03-28 NOTE — Progress Notes (Signed)
I have spent 5 minutes in review of e-visit questionnaire, review and updating patient chart, medical decision making and response to patient.   Jamaiya Tunnell Cody Consandra Laske, PA-C    

## 2022-04-10 ENCOUNTER — Other Ambulatory Visit (HOSPITAL_COMMUNITY): Payer: Self-pay

## 2022-04-20 ENCOUNTER — Other Ambulatory Visit (HOSPITAL_COMMUNITY): Payer: Self-pay

## 2022-05-08 ENCOUNTER — Other Ambulatory Visit (HOSPITAL_COMMUNITY): Payer: Self-pay

## 2022-05-08 ENCOUNTER — Other Ambulatory Visit: Payer: Self-pay

## 2022-05-30 ENCOUNTER — Telehealth: Payer: Self-pay

## 2022-05-30 ENCOUNTER — Ambulatory Visit (INDEPENDENT_AMBULATORY_CARE_PROVIDER_SITE_OTHER): Payer: 59 | Admitting: Family Medicine

## 2022-05-30 ENCOUNTER — Other Ambulatory Visit: Payer: Self-pay

## 2022-05-30 ENCOUNTER — Encounter: Payer: Self-pay | Admitting: Family Medicine

## 2022-05-30 ENCOUNTER — Other Ambulatory Visit (HOSPITAL_COMMUNITY): Payer: Self-pay

## 2022-05-30 VITALS — BP 114/77 | HR 90 | Temp 98.2°F | Ht 63.75 in | Wt 148.8 lb

## 2022-05-30 DIAGNOSIS — R7989 Other specified abnormal findings of blood chemistry: Secondary | ICD-10-CM

## 2022-05-30 DIAGNOSIS — E7800 Pure hypercholesterolemia, unspecified: Secondary | ICD-10-CM | POA: Diagnosis not present

## 2022-05-30 DIAGNOSIS — R7303 Prediabetes: Secondary | ICD-10-CM

## 2022-05-30 DIAGNOSIS — Z7689 Persons encountering health services in other specified circumstances: Secondary | ICD-10-CM | POA: Diagnosis not present

## 2022-05-30 DIAGNOSIS — E669 Obesity, unspecified: Secondary | ICD-10-CM | POA: Diagnosis not present

## 2022-05-30 DIAGNOSIS — F5101 Primary insomnia: Secondary | ICD-10-CM | POA: Diagnosis not present

## 2022-05-30 DIAGNOSIS — E66811 Obesity, class 1: Secondary | ICD-10-CM

## 2022-05-30 LAB — COMPREHENSIVE METABOLIC PANEL
ALT: 14 U/L (ref 0–35)
AST: 15 U/L (ref 0–37)
Albumin: 4 g/dL (ref 3.5–5.2)
Alkaline Phosphatase: 39 U/L (ref 39–117)
BUN: 13 mg/dL (ref 6–23)
CO2: 25 mEq/L (ref 19–32)
Calcium: 9.6 mg/dL (ref 8.4–10.5)
Chloride: 102 mEq/L (ref 96–112)
Creatinine, Ser: 0.9 mg/dL (ref 0.40–1.20)
GFR: 87.16 mL/min (ref >60.00)
Glucose, Bld: 70 mg/dL (ref 70–99)
Potassium: 4.7 mEq/L (ref 3.5–5.1)
Sodium: 135 mEq/L (ref 135–145)
Total Bilirubin: 0.8 mg/dL (ref 0.2–1.2)
Total Protein: 7 g/dL (ref 6.0–8.3)

## 2022-05-30 LAB — LIPID PANEL
Cholesterol: 194 mg/dL (ref 0–200)
HDL: 61.5 mg/dL (ref >39.00)
LDL Cholesterol: 116 mg/dL — ABNORMAL HIGH (ref 0–99)
NonHDL: 132.07
Total CHOL/HDL Ratio: 3
Triglycerides: 81 mg/dL (ref 0.0–149.0)
VLDL: 16.2 mg/dL (ref 0.0–40.0)

## 2022-05-30 LAB — TSH: TSH: 4.6 u[IU]/mL (ref 0.35–5.50)

## 2022-05-30 LAB — T4, FREE: Free T4: 0.72 ng/dL (ref 0.60–1.60)

## 2022-05-30 LAB — HEMOGLOBIN A1C: Hgb A1c MFr Bld: 5.2 % (ref 4.6–6.5)

## 2022-05-30 MED ORDER — ATORVASTATIN CALCIUM 40 MG PO TABS
40.0000 mg | ORAL_TABLET | Freq: Every day | ORAL | 1 refills | Status: DC
  Filled 2022-05-30: qty 90, 90d supply, fill #0

## 2022-05-30 MED ORDER — TEMAZEPAM 15 MG PO CAPS
15.0000 mg | ORAL_CAPSULE | Freq: Every evening | ORAL | 1 refills | Status: DC | PRN
  Filled 2022-05-30 – 2022-06-12 (×2): qty 90, 90d supply, fill #0
  Filled 2022-09-07: qty 90, 90d supply, fill #1

## 2022-05-30 MED ORDER — SERTRALINE HCL 100 MG PO TABS
100.0000 mg | ORAL_TABLET | Freq: Every day | ORAL | 1 refills | Status: DC
  Filled 2022-05-30 – 2022-08-02 (×3): qty 90, 90d supply, fill #0
  Filled 2022-11-01: qty 90, 90d supply, fill #1

## 2022-05-30 NOTE — Telephone Encounter (Signed)
[  11:07 AM] Ricardo Jericho Pls call [facility] and cancel RF of atorvastatin that I sent today.-thx  Refill cancelled with pharmacy

## 2022-05-30 NOTE — Progress Notes (Signed)
OFFICE VISIT  05/30/2022  CC:  Chief Complaint  Patient presents with   Medical Management of Chronic Issues    Patient is a 27 y.o. female who presents for 37-monthfollow-up weight management and insomnia.   A/P as of 11/24/21 f/u visit: A/P as of last visit: "1 primary insomnia. Improved with temazepam 15 mg a day. Continue 15 mg temazepam, 1 tab 2 at bedtime as needed, #90, refill x1. Controlled substance contract initiated today.   #2 excessive daytime sleepiness. Needs to rule out obstructive sleep apnea.  This process was started a few years ago and patient will contact her sleep MD to reestablish.   #3 obesity. She continues to do well on Wegovy at the 1.7 milligram weekly dosing. Continue this"  INTERIM HX: HKinlynfeels great. We increased her Wegovy dose to 2.4 mg weekly a couple of months ago. Wt down to 148 lbs!  BMI 26! Minimal nausea the first day of her injection, no vomiting.  She takes her 50 mg temazepam every night and this helps very well for sleep.  PMP AWARE reviewed today: most recent rx for temazepam was filled 03/09/2022, # [id number] rx by me. No red flags.  ROS as above, plus--> no fevers, no CP, no SOB, no wheezing, no cough, no dizziness, no HAs, no rashes, no melena/hematochezia.  No polyuria or polydipsia.  No myalgias or arthralgias.  No focal weakness, paresthesias, or tremors.  No acute vision or hearing abnormalities.  No dysuria or unusual/new urinary urgency or frequency.  No recent changes in lower legs. No n/v/d or abd pain.  No palpitations.    Past Medical History:  Diagnosis Date   Frequent headaches    GAD (generalized anxiety disorder)    GERD (gastroesophageal reflux disease)    Gout 11/2016   History of cholecystitis 11/2019   with gallstones; cholecystectomy 11/06/19   Hyperlipemia, mixed    recommended statin 09/2019   Hypersomnolence 2019   Noct polysomn + multiple sleep latency testing planned as of Nov 2019 neuro eval.    Migraines    Obesity, Class II, BMI 35-39.9    PMDD (premenstrual dysphoric disorder) 11/2016   Postcholecystectomy syndrome    Prediabetes    Subclinical hypothyroidism 02/2018   + TPO ab mildly elevated-->evolving hashimoto's.  TSH back to normal 04/23/2018.    Past Surgical History:  Procedure Laterality Date   CHOLECYSTECTOMY N/A 11/06/2019   Procedure: LAPAROSCOPIC CHOLECYSTECTOMY WITH INDOCYANINE GREEN DYE;  Surgeon: WRolm Bookbinder MD;  Location: [facility]  Service: General;  Laterality: N/A;   TONSILLECTOMY     child    Outpatient Medications Prior to Visit  Medication Sig Dispense Refill   norethindrone-ethinyl estradiol 1/35 (ORTHO-NOVUM) tablet Take 1 tablet by mouth at the same time every day. Take continuously skipping placebos as directed. 84 tablet 0   omeprazole (PRILOSEC) 40 MG capsule TAKE 1 CAPSULE (40 MG TOTAL) BY MOUTH IN THE MORNING AND 1 AT BEDTIME. 60 capsule 11   ondansetron (ZOFRAN-ODT) 4 MG disintegrating tablet Dissolve 0.5-1 tablets (2-4 mg total) by mouth 3 (three) times daily. 60 tablet 1   Semaglutide-Weight Management (WEGOVY) 2.4 MG/0.75ML SOAJ Inject 2.4 mg into the skin once a week. 3 mL 3   atorvastatin (LIPITOR) 40 MG tablet Take 1 tablet (40 mg total) by mouth daily. 90 tablet 1   cephALEXin (KEFLEX) 500 MG capsule Take 1 capsule (500 mg total) by mouth 4 (four) times daily. 20 capsule 0   cyclobenzaprine (FLEXERIL)  10 MG tablet Take 1 tablet (10 mg total) by mouth 3 (three) times daily as needed for muscle spasms. 30 tablet 0   sertraline (ZOLOFT) 100 MG tablet Take 1 tablet (100 mg total) by mouth daily. 90 tablet 1   temazepam (RESTORIL) 15 MG capsule Take 1 capsule by mouth at bedtime as needed for sleep. 90 capsule 1   No facility-administered medications prior to visit.    No Known Allergies  Review of Systems As per HPI  PE:    05/30/2022   10:39 AM 11/30/2021    1:40 PM 11/24/2021   10:56 AM  Vitals with BMI   Height 5' 3.75" 5' 3.75" '5\' 3"'$   Weight 148 lbs 13 oz 166 lbs 6 oz 166 lbs 10 oz  BMI 25.75 0000000 [id number]  Systolic [id number]  Diastolic 77 77 73  Pulse 90 88 87     Physical Exam  Gen: Alert, well appearing.  Patient is oriented to person, place, time, and situation. AFFECT: pleasant, lucid thought and speech. No further exam today  LABS:  Last CBC Lab Results  Component Value Date   WBC 10.5 03/09/2021   HGB 13.8 03/09/2021   HCT 41.2 03/09/2021   MCV 87.0 03/09/2021   MCH 30.0 11/01/2019   RDW 13.6 03/09/2021   PLT 381.0 0000000   Last metabolic panel Lab Results  Component Value Date   GLUCOSE 78 06/29/2021   NA 137 06/29/2021   K 4.2 06/29/2021   CL 103 06/29/2021   CO2 26 06/29/2021   BUN 10 06/29/2021   CREATININE 0.89 06/29/2021   GFRNONAA >60 11/04/2019   CALCIUM 9.5 06/29/2021   PROT 7.5 06/29/2021   ALBUMIN 4.3 06/29/2021   BILITOT 0.4 06/29/2021   ALKPHOS 61 06/29/2021   AST 26 06/29/2021   ALT 45 (H) 06/29/2021   ANIONGAP 9 11/04/2019   Last lipids Lab Results  Component Value Date   CHOL 215 (H) 06/29/2021   HDL 55.70 06/29/2021   LDLCALC 136 (H) 06/29/2021   LDLDIRECT 117.0 08/02/2020   TRIG 114.0 06/29/2021   CHOLHDL 4 06/29/2021   Last hemoglobin A1c Lab Results  Component Value Date   HGBA1C 5.8 06/29/2021   Last thyroid functions Lab Results  Component Value Date   TSH 6.83 (H) 03/09/2021   T3TOTAL 142 03/09/2021   IMPRESSION AND PLAN:  #1 weight management:  Obesity, with comorbidities of hyperlipidemia and prediabetes. Excellent weight loss with [id number].  Continue the 2.4 mg weekly dose indefinitely. Continue excellent diet and exercise. Check hemoglobin A1c and lipid panel today.  2.  Insomnia.  Doing well on temazepam 15 mg at night. 15 mg tabs, #90, refill x 1.  3.  Hyperlipidemia, has done well on atorvastatin 40 mg a day. Lipid panel today.  If at all possible she would love to get off of her statin.  An  After Visit Summary was printed and given to the patient.  FOLLOW UP: Return in about 6 months (around 11/28/2022) for routine chronic illness f/u. Next cpe 11/2022  Signed:  Crissie Sickles, MD           05/30/2022

## 2022-05-30 NOTE — Patient Instructions (Signed)
Please schedule an appointment with Dr.Wein to complete your pap screening. [phone])

## 2022-05-31 ENCOUNTER — Other Ambulatory Visit: Payer: Self-pay | Admitting: Family Medicine

## 2022-05-31 ENCOUNTER — Other Ambulatory Visit: Payer: Self-pay

## 2022-05-31 LAB — T3: T3, Total: 117 ng/dL (ref 76–181)

## 2022-05-31 MED ORDER — ONDANSETRON 4 MG PO TBDP
2.0000 mg | ORAL_TABLET | Freq: Three times a day (TID) | ORAL | 1 refills | Status: DC
  Filled 2022-05-31: qty 60, 20d supply, fill #0
  Filled 2022-08-02: qty 60, 20d supply, fill #1

## 2022-06-02 ENCOUNTER — Ambulatory Visit: Payer: No Typology Code available for payment source | Admitting: Family Medicine

## 2022-06-12 ENCOUNTER — Other Ambulatory Visit: Payer: Self-pay

## 2022-06-12 ENCOUNTER — Other Ambulatory Visit (HOSPITAL_COMMUNITY): Payer: Self-pay

## 2022-06-21 ENCOUNTER — Other Ambulatory Visit: Payer: Self-pay

## 2022-06-21 ENCOUNTER — Other Ambulatory Visit: Payer: Self-pay | Admitting: Family Medicine

## 2022-06-21 MED ORDER — WEGOVY 2.4 MG/0.75ML ~~LOC~~ SOAJ
2.4000 mg | SUBCUTANEOUS | 3 refills | Status: DC
  Filled 2022-06-21: qty 3, 28d supply, fill #0
  Filled 2022-07-17: qty 3, 28d supply, fill #1

## 2022-06-21 NOTE — Telephone Encounter (Signed)
Please advise if refill appropriate.  

## 2022-06-22 ENCOUNTER — Other Ambulatory Visit (HOSPITAL_COMMUNITY): Payer: Self-pay

## 2022-06-22 ENCOUNTER — Other Ambulatory Visit: Payer: Self-pay

## 2022-07-17 ENCOUNTER — Other Ambulatory Visit (HOSPITAL_COMMUNITY): Payer: Self-pay

## 2022-07-18 ENCOUNTER — Other Ambulatory Visit (HOSPITAL_COMMUNITY): Payer: Self-pay

## 2022-07-18 ENCOUNTER — Encounter (HOSPITAL_COMMUNITY): Payer: Self-pay

## 2022-07-21 ENCOUNTER — Other Ambulatory Visit: Payer: Self-pay

## 2022-08-02 ENCOUNTER — Other Ambulatory Visit: Payer: Self-pay

## 2022-08-02 ENCOUNTER — Other Ambulatory Visit (HOSPITAL_COMMUNITY): Payer: Self-pay

## 2022-09-07 ENCOUNTER — Other Ambulatory Visit (HOSPITAL_COMMUNITY): Payer: Self-pay

## 2022-09-07 ENCOUNTER — Other Ambulatory Visit: Payer: Self-pay | Admitting: Family Medicine

## 2022-09-08 ENCOUNTER — Other Ambulatory Visit: Payer: Self-pay

## 2022-09-08 ENCOUNTER — Other Ambulatory Visit (HOSPITAL_COMMUNITY): Payer: Self-pay

## 2022-09-08 MED ORDER — OMEPRAZOLE 40 MG PO CPDR
40.0000 mg | DELAYED_RELEASE_CAPSULE | Freq: Two times a day (BID) | ORAL | 1 refills | Status: DC
  Filled 2022-09-08: qty 60, 30d supply, fill #0
  Filled 2022-11-01: qty 60, 30d supply, fill #1

## 2022-11-01 ENCOUNTER — Other Ambulatory Visit: Payer: Self-pay

## 2022-11-01 ENCOUNTER — Other Ambulatory Visit: Payer: Self-pay | Admitting: Oncology

## 2022-11-01 DIAGNOSIS — Z006 Encounter for examination for normal comparison and control in clinical research program: Secondary | ICD-10-CM

## 2022-11-27 ENCOUNTER — Other Ambulatory Visit (HOSPITAL_COMMUNITY): Payer: Self-pay

## 2022-11-27 ENCOUNTER — Other Ambulatory Visit: Payer: Self-pay | Admitting: Family Medicine

## 2022-11-27 MED ORDER — TEMAZEPAM 15 MG PO CAPS
15.0000 mg | ORAL_CAPSULE | Freq: Every evening | ORAL | 0 refills | Status: DC | PRN
  Filled 2022-11-27 – 2022-12-05 (×3): qty 30, 30d supply, fill #0

## 2022-11-27 MED ORDER — OMEPRAZOLE 40 MG PO CPDR
40.0000 mg | DELAYED_RELEASE_CAPSULE | Freq: Two times a day (BID) | ORAL | 0 refills | Status: DC
  Filled 2022-11-27: qty 60, 30d supply, fill #0

## 2022-11-27 NOTE — Telephone Encounter (Signed)
Please advise on RF for Temazepam. Pt is currently due for f/u this month, pending for 30 d/s.

## 2022-11-27 NOTE — Telephone Encounter (Signed)
#  30 prescribed. Needs office visit

## 2022-11-28 ENCOUNTER — Other Ambulatory Visit (HOSPITAL_COMMUNITY): Payer: Self-pay

## 2022-12-03 ENCOUNTER — Other Ambulatory Visit (HOSPITAL_COMMUNITY): Payer: Self-pay

## 2022-12-05 ENCOUNTER — Ambulatory Visit (INDEPENDENT_AMBULATORY_CARE_PROVIDER_SITE_OTHER): Payer: 59 | Admitting: Family Medicine

## 2022-12-05 ENCOUNTER — Other Ambulatory Visit (HOSPITAL_COMMUNITY): Payer: Self-pay

## 2022-12-05 ENCOUNTER — Other Ambulatory Visit: Payer: Self-pay

## 2022-12-05 ENCOUNTER — Encounter: Payer: Self-pay | Admitting: Family Medicine

## 2022-12-05 VITALS — BP 129/71 | HR 77 | Wt 166.6 lb

## 2022-12-05 DIAGNOSIS — R7303 Prediabetes: Secondary | ICD-10-CM

## 2022-12-05 DIAGNOSIS — E663 Overweight: Secondary | ICD-10-CM | POA: Diagnosis not present

## 2022-12-05 DIAGNOSIS — Z7689 Persons encountering health services in other specified circumstances: Secondary | ICD-10-CM

## 2022-12-05 DIAGNOSIS — F5101 Primary insomnia: Secondary | ICD-10-CM

## 2022-12-05 DIAGNOSIS — E7800 Pure hypercholesterolemia, unspecified: Secondary | ICD-10-CM | POA: Diagnosis not present

## 2022-12-05 DIAGNOSIS — Z23 Encounter for immunization: Secondary | ICD-10-CM

## 2022-12-05 MED ORDER — QSYMIA 7.5-46 MG PO CP24
1.0000 | ORAL_CAPSULE | Freq: Every day | ORAL | 0 refills | Status: DC
  Filled 2022-12-05 – 2023-01-11 (×4): qty 30, 30d supply, fill #0

## 2022-12-05 MED ORDER — QSYMIA 3.75-23 MG PO CP24
ORAL_CAPSULE | ORAL | 0 refills | Status: DC
  Filled 2022-12-05: qty 45, 30d supply, fill #0
  Filled 2022-12-22 (×2): qty 14, 14d supply, fill #0

## 2022-12-05 NOTE — Progress Notes (Signed)
OFFICE VISIT  12/05/2022  CC:  Chief Complaint  Patient presents with   Medical Management of Chronic Issues    Patient is a 27 y.o. female who presents for 1-month follow-up weight management, hyperlipidemia, prediabetes, and insomnia. A/P as of last visit: "#1 weight management:  Obesity, with comorbidities of hyperlipidemia and prediabetes. Excellent weight loss with ZOXWRU.  Continue the 2.4 mg weekly dose indefinitely. Continue excellent diet and exercise. Check hemoglobin A1c and lipid panel today.   2.  Insomnia.  Doing well on temazepam 15 mg at night. 15 mg tabs, #90, refill x 1.   3.  Hyperlipidemia, has done well on atorvastatin 40 mg a day. Lipid panel today.  If at all possible she would love to get off of her statin."  INTERIM HX: Lipid panel was excellent last visit so we discontinued her atorvastatin.  Brittany Cooper is struggling.  Her insurer no longer covers Wegovy.  She has been off of it 3 months. Her weight is 18 pounds up from last visit here 6 months ago. She says she feels very hungry all the time, rarely gets full.   She admits she is not exercising consistently.  Work is very stressful for her lately.  She is looking for a new job while pushing through this 1. Sleep is even more of an issue lately, says her mind will not shut down.  She takes a 15 mg temazepam every night but also has to add 50 mg Benadryl.  She does not feel hung over in the mornings.  ROS: No polyuria or polydipsia.  No blurry vision.  No rash.  No cold intolerance. She does feel tired all day. PMP AWARE reviewed today: most recent rx for temazepam 15 mg was filled 09/08/2022, # 90, rx by me. No red flags.  Past Medical History:  Diagnosis Date   Frequent headaches    GAD (generalized anxiety disorder)    GERD (gastroesophageal reflux disease)    Gout 11/2016   History of cholecystitis 11/2019   with gallstones; cholecystectomy 11/06/19   Hyperlipemia, mixed    recommended statin 09/2019    Hypersomnolence 2019   Noct polysomn + multiple sleep latency testing planned as of Nov 2019 neuro eval.   Migraines    Obesity, Class II, BMI 35-39.9    PMDD (premenstrual dysphoric disorder) 11/2016   Postcholecystectomy syndrome    Prediabetes    Subclinical hypothyroidism 02/2018   + TPO ab mildly elevated-->evolving hashimoto's.  TSH back to normal 04/23/2018.    Past Surgical History:  Procedure Laterality Date   CHOLECYSTECTOMY N/A 11/06/2019   Procedure: LAPAROSCOPIC CHOLECYSTECTOMY WITH INDOCYANINE GREEN DYE;  Surgeon: Emelia Loron, MD;  Location: [facility];  Service: General;  Laterality: N/A;   TONSILLECTOMY     child    Outpatient Medications Prior to Visit  Medication Sig Dispense Refill   norethindrone-ethinyl estradiol 1/35 (ORTHO-NOVUM) tablet Take 1 tablet by mouth at the same time every day. Take continuously skipping placebos as directed. 84 tablet 0   omeprazole (PRILOSEC) 40 MG capsule Take 1 capsule (40 mg total) by mouth in the morning and at bedtime. OFFICE VISIT NEEDED FOR FURTHER REFILLS 60 capsule 0   ondansetron (ZOFRAN-ODT) 4 MG disintegrating tablet Dissolve 0.5-1 tablets (2-4 mg total) by mouth 3 (three) times daily. 60 tablet 1   sertraline (ZOLOFT) 100 MG tablet Take 1 tablet (100 mg total) by mouth daily. 90 tablet 1   temazepam (RESTORIL) 15 MG capsule Take 1 capsule (  15 mg total) by mouth at bedtime as needed for sleep. OFFICE VISIT NEEDED FOR FURTHER REFILLS 30 capsule 0   Semaglutide-Weight Management (WEGOVY) 2.4 MG/0.75ML SOAJ Inject 2.4 mg into the skin once a week. 3 mL 3   No facility-administered medications prior to visit.    No Known Allergies  Review of Systems As per HPI  PE:    12/05/2022    9:51 AM 05/30/2022   10:39 AM 11/30/2021    1:40 PM  Vitals with BMI  Height  5' 3.75" 5' 3.75"  Weight 166 lbs 10 oz 148 lbs 13 oz 166 lbs 6 oz  BMI  25.75 28.8  Systolic 129 114 409  Diastolic 71 77 77  Pulse 77  90 88     Physical Exam  Gen: Alert, well appearing.  Patient is oriented to person, place, time, and situation. AFFECT: pleasant, lucid thought and speech. No further exam today  LABS:  Last CBC Lab Results  Component Value Date   WBC 10.5 03/09/2021   HGB 13.8 03/09/2021   HCT 41.2 03/09/2021   MCV 87.0 03/09/2021   MCH 30.0 11/01/2019   RDW 13.6 03/09/2021   PLT 381.0 03/09/2021   Last metabolic panel Lab Results  Component Value Date   GLUCOSE 70 05/30/2022   NA 135 05/30/2022   K 4.7 05/30/2022   CL 102 05/30/2022   CO2 25 05/30/2022   BUN 13 05/30/2022   CREATININE 0.90 05/30/2022   GFR 87.16 05/30/2022   CALCIUM 9.6 05/30/2022   PROT 7.0 05/30/2022   ALBUMIN 4.0 05/30/2022   BILITOT 0.8 05/30/2022   ALKPHOS 39 05/30/2022   AST 15 05/30/2022   ALT 14 05/30/2022   ANIONGAP 9 11/04/2019   Last lipids Lab Results  Component Value Date   CHOL 194 05/30/2022   HDL 61.50 05/30/2022   LDLCALC 116 (H) 05/30/2022   LDLDIRECT 117.0 08/02/2020   TRIG 81.0 05/30/2022   CHOLHDL 3 05/30/2022   Last hemoglobin A1c Lab Results  Component Value Date   HGBA1C 5.2 05/30/2022   Last thyroid functions Lab Results  Component Value Date   TSH 4.60 05/30/2022   T3TOTAL 117 05/30/2022   IMPRESSION AND PLAN:  #1 weight management.  Her BMI is 29.  She has prediabetes and hypercholesterolemia. Had done great on Wegovy but her insurance no longer covers this. She feels like she can never get full. We will do trial of Qsymia.  Start with 3.75/23 mg once a day for 2 weeks.  Then increase to 7 mg / 46 mg daily dose. Therapeutic expectations and side effect profile of medication discussed today.  Patient's questions answered. Encouraged her to try to exercise more.  2.  Insomnia, had previously been pretty well-controlled on Restoril 15 mg at night.  She has had to add 50 mg Benadryl to this lately. No changes today. Controlled substance contract updated today.  Will  do urine drug screen at next follow-up.  #3 hypercholesterolemia. We had her on statin at 1 point but when numbers came down she wanted to get off for a while. We will recheck lipid panel in 3 to 6 months.  #4 prediabetes.  Her hemoglobin A1c 6 months ago was excellent--> 5.2%. Plan repeat hemoglobin A1c in 3 to 6 months.  An After Visit Summary was printed and given to the patient.  FOLLOW UP: Return for 5-6 wks f/u wt mgmt.  Signed:  Santiago Bumpers, MD  12/05/2022  

## 2022-12-05 NOTE — Addendum Note (Signed)
Addended by: Arty Baumgartner A on: 12/05/2022 10:36 AM   Modules accepted: Orders

## 2022-12-06 ENCOUNTER — Telehealth: Payer: Self-pay

## 2022-12-06 ENCOUNTER — Other Ambulatory Visit (HOSPITAL_COMMUNITY): Payer: Self-pay

## 2022-12-13 ENCOUNTER — Other Ambulatory Visit (HOSPITAL_COMMUNITY): Payer: Self-pay

## 2022-12-14 ENCOUNTER — Telehealth: Payer: Self-pay

## 2022-12-14 ENCOUNTER — Other Ambulatory Visit (HOSPITAL_COMMUNITY): Payer: Self-pay

## 2022-12-14 NOTE — Telephone Encounter (Signed)
Pharmacy Patient Advocate Encounter  Received notification from [facility] that Prior Authorization for Qsymia 3.75-23MG  er capsules has been  SENT TO PLAN    PA #/Case ID/Reference #: [id number] (Key: [id number])

## 2022-12-15 NOTE — Telephone Encounter (Signed)
No action needed at this time.

## 2022-12-19 ENCOUNTER — Other Ambulatory Visit (HOSPITAL_COMMUNITY): Payer: Self-pay

## 2022-12-19 NOTE — Telephone Encounter (Signed)
Pharmacy Patient Advocate Encounter  Received notification from [facility] that Prior Authorization for Qsymia 3.75-23MG  er capsules has been APPROVED from 12/18/2022 to 03/29/2023

## 2022-12-22 ENCOUNTER — Other Ambulatory Visit (HOSPITAL_COMMUNITY): Payer: Self-pay

## 2023-01-01 ENCOUNTER — Other Ambulatory Visit (HOSPITAL_COMMUNITY): Payer: Self-pay

## 2023-01-03 ENCOUNTER — Other Ambulatory Visit: Payer: Self-pay | Admitting: Family Medicine

## 2023-01-03 ENCOUNTER — Other Ambulatory Visit (HOSPITAL_COMMUNITY): Payer: Self-pay

## 2023-01-04 ENCOUNTER — Other Ambulatory Visit: Payer: Self-pay

## 2023-01-04 ENCOUNTER — Other Ambulatory Visit (HOSPITAL_COMMUNITY): Payer: Self-pay

## 2023-01-04 MED ORDER — TEMAZEPAM 15 MG PO CAPS
15.0000 mg | ORAL_CAPSULE | Freq: Every evening | ORAL | 5 refills | Status: DC | PRN
  Filled 2023-01-04 – 2023-01-11 (×2): qty 30, 30d supply, fill #0
  Filled 2023-02-21: qty 30, 30d supply, fill #1
  Filled 2023-03-16: qty 30, 30d supply, fill #2

## 2023-01-05 ENCOUNTER — Other Ambulatory Visit (HOSPITAL_COMMUNITY): Payer: Self-pay

## 2023-01-08 ENCOUNTER — Other Ambulatory Visit: Payer: Self-pay

## 2023-01-11 ENCOUNTER — Other Ambulatory Visit: Payer: Self-pay

## 2023-01-12 ENCOUNTER — Other Ambulatory Visit: Payer: Self-pay

## 2023-01-12 ENCOUNTER — Other Ambulatory Visit (HOSPITAL_COMMUNITY): Payer: Self-pay

## 2023-01-25 ENCOUNTER — Other Ambulatory Visit: Payer: Self-pay | Admitting: Family Medicine

## 2023-01-26 ENCOUNTER — Other Ambulatory Visit: Payer: Self-pay

## 2023-01-26 MED ORDER — OMEPRAZOLE 40 MG PO CPDR
40.0000 mg | DELAYED_RELEASE_CAPSULE | Freq: Two times a day (BID) | ORAL | 1 refills | Status: DC
Start: 2023-01-26 — End: 2023-04-13
  Filled 2023-01-26: qty 60, 30d supply, fill #0
  Filled 2023-03-16: qty 60, 30d supply, fill #1

## 2023-01-26 MED ORDER — SERTRALINE HCL 100 MG PO TABS
100.0000 mg | ORAL_TABLET | Freq: Every day | ORAL | 1 refills | Status: DC
  Filled 2023-01-26: qty 90, 90d supply, fill #0

## 2023-02-21 ENCOUNTER — Other Ambulatory Visit (HOSPITAL_COMMUNITY): Payer: Self-pay

## 2023-02-22 ENCOUNTER — Other Ambulatory Visit (HOSPITAL_COMMUNITY): Payer: Self-pay

## 2023-02-22 NOTE — Progress Notes (Deleted)
OFFICE VISIT  02/22/2023  CC: No chief complaint on file.   Patient is a 27 y.o. female who presents for f/u wt mgmt, GERD, insomnia. A/P as of last visit: "1 weight management.  Her BMI is 29.  She has prediabetes and hypercholesterolemia. Had done great on Wegovy but her insurance no longer covers this. She feels like she can never get full. We will do trial of Qsymia.  Start with 3.75/23 mg once a day for 2 weeks.  Then increase to 7 mg / 46 mg daily dose. Therapeutic expectations and side effect profile of medication discussed today.  Patient's questions answered. Encouraged her to try to exercise more.   2.  Insomnia, had previously been pretty well-controlled on Restoril 15 mg at night.  She has had to add 50 mg Benadryl to this lately. No changes today. Controlled substance contract updated today.  Will do urine drug screen at next follow-up.   #3 hypercholesterolemia. We had her on statin at 1 point but when numbers came down she wanted to get off for a while. We will recheck lipid panel in 3 to 6 months.   #4 prediabetes.  Her hemoglobin A1c 6 months ago was excellent--> 5.2%. Plan repeat hemoglobin A1c in 3 to 6 months."  INTERIM HX: ***   PMP AWARE reviewed today: most recent rx for qsymia was filled 01/12/23, # 30, rx by me. Most recent temazepam rx filled 01/12/23, #30, rx by me. No red flags.  Past Medical History:  Diagnosis Date   Frequent headaches    GAD (generalized anxiety disorder)    GERD (gastroesophageal reflux disease)    Gout 11/2016   History of cholecystitis 11/2019   with gallstones; cholecystectomy 11/06/19   Hyperlipemia, mixed    recommended statin 09/2019   Hypersomnolence 2019   Noct polysomn + multiple sleep latency testing planned as of Nov 2019 neuro eval.   Migraines    Obesity, Class II, BMI 35-39.9    PMDD (premenstrual dysphoric disorder) 11/2016   Postcholecystectomy syndrome    Prediabetes    Subclinical hypothyroidism  02/2018   + TPO ab mildly elevated-->evolving hashimoto's.  TSH back to normal 04/23/2018.    Past Surgical History:  Procedure Laterality Date   CHOLECYSTECTOMY N/A 11/06/2019   Procedure: LAPAROSCOPIC CHOLECYSTECTOMY WITH INDOCYANINE GREEN DYE;  Surgeon: Emelia Loron, MD;  Location: [facility];  Service: General;  Laterality: N/A;   TONSILLECTOMY     child    Outpatient Medications Prior to Visit  Medication Sig Dispense Refill   norethindrone-ethinyl estradiol 1/35 (ORTHO-NOVUM) tablet Take 1 tablet by mouth at the same time every day. Take continuously skipping placebos as directed. 84 tablet 0   omeprazole (PRILOSEC) 40 MG capsule Take 1 capsule (40 mg total) by mouth in the morning and at bedtime. OFFICE VISIT NEEDED FOR FURTHER REFILLS 60 capsule 1   ondansetron (ZOFRAN-ODT) 4 MG disintegrating tablet Dissolve 0.5-1 tablets (2-4 mg total) by mouth 3 (three) times daily. 60 tablet 1   Phentermine-Topiramate (QSYMIA) 3.75-23 MG CP24 Take 1 capsule by mouth daily for 15 days, THEN 2 capsules daily for 15 days. 45 capsule 0   Phentermine-Topiramate (QSYMIA) 7.5-46 MG CP24 Take 1 capsule by mouth daily. 30 capsule 0   sertraline (ZOLOFT) 100 MG tablet Take 1 tablet (100 mg total) by mouth daily. 90 tablet 1   temazepam (RESTORIL) 15 MG capsule Take 1 capsule (15 mg total) by mouth at bedtime as needed for sleep. 30 capsule  5   No facility-administered medications prior to visit.    No Known Allergies  Review of Systems As per HPI  PE:    12/05/2022    9:51 AM 05/30/2022   10:39 AM 11/30/2021    1:40 PM  Vitals with BMI  Height  5' 3.75" 5' 3.75"  Weight 166 lbs 10 oz 148 lbs 13 oz 166 lbs 6 oz  BMI  25.75 28.8  Systolic 129 114 161  Diastolic 71 77 77  Pulse 77 90 88     Physical Exam  ***  LABS:  Last CBC Lab Results  Component Value Date   WBC 10.5 03/09/2021   HGB 13.8 03/09/2021   HCT 41.2 03/09/2021   MCV 87.0 03/09/2021   MCH 30.0  11/01/2019   RDW 13.6 03/09/2021   PLT 381.0 03/09/2021   Last metabolic panel Lab Results  Component Value Date   GLUCOSE 70 05/30/2022   NA 135 05/30/2022   K 4.7 05/30/2022   CL 102 05/30/2022   CO2 25 05/30/2022   BUN 13 05/30/2022   CREATININE 0.90 05/30/2022   GFR 87.16 05/30/2022   CALCIUM 9.6 05/30/2022   PROT 7.0 05/30/2022   ALBUMIN 4.0 05/30/2022   BILITOT 0.8 05/30/2022   ALKPHOS 39 05/30/2022   AST 15 05/30/2022   ALT 14 05/30/2022   ANIONGAP 9 11/04/2019   Last lipids Lab Results  Component Value Date   CHOL 194 05/30/2022   HDL 61.50 05/30/2022   LDLCALC 116 (H) 05/30/2022   LDLDIRECT 117.0 08/02/2020   TRIG 81.0 05/30/2022   CHOLHDL 3 05/30/2022   Last hemoglobin A1c Lab Results  Component Value Date   HGBA1C 5.2 05/30/2022   Last thyroid functions Lab Results  Component Value Date   TSH 4.60 05/30/2022   T3TOTAL 117 05/30/2022   IMPRESSION AND PLAN:  No problem-specific Assessment & Plan notes found for this encounter.   An After Visit Summary was printed and given to the patient.  FOLLOW UP: No follow-ups on file.  Signed:  Santiago Bumpers, MD           02/22/2023

## 2023-02-23 ENCOUNTER — Ambulatory Visit: Payer: 59 | Admitting: Family Medicine

## 2023-03-13 ENCOUNTER — Encounter: Payer: Self-pay | Admitting: Family Medicine

## 2023-03-17 ENCOUNTER — Other Ambulatory Visit (HOSPITAL_COMMUNITY): Payer: Self-pay

## 2023-04-12 NOTE — Progress Notes (Signed)
 OFFICE VISIT  05/10/2023  CC:  Chief Complaint  Patient presents with   Weight Management Screening    Pt states Qsymia  did not work well for her; insurance no longer covers med.     Patient is a 28 y.o. female who presents for wt mgmt/obesity and f/u insomnia. Last visit 12/05/22 I started her on Qsymia  b/c insurer stopped covering the wegovy  which had worked very well for her.  INTERIM HX: Qsymia  no help. She stopped it.  She is up 30 pounds in the last 4 months. Endorses polydipsia, polyuria, polyphagia.  Has had many years history of feeling excessively distracted, poor concentration, easily frustrated with complex tasks, feels internally driven but not overtly hyperactive or impulsive. Mood has been good. Anxiety well-controlled currently.  Sleep is good with temazepam . PMP AWARE reviewed today: most recent rx for temazepam  was filled 03/17/23, # 30, rx by me.  Qsymia  last filled 01/12/23, #30. No red flags.   Past Medical History:  Diagnosis Date   Frequent headaches    GAD (generalized anxiety disorder)    GERD (gastroesophageal reflux disease)    Gout 11/2016   History of cholecystitis 11/2019   with gallstones; cholecystectomy 11/06/19   Hyperlipemia, mixed    recommended statin 09/2019   Hypersomnolence 2019   Noct polysomn + multiple sleep latency testing planned as of Nov 2019 neuro eval.   Migraines    Obesity, Class II, BMI 35-39.9    PMDD (premenstrual dysphoric disorder) 11/2016   Postcholecystectomy syndrome    Prediabetes    Subclinical hypothyroidism 02/2018   + TPO ab mildly elevated-->evolving hashimoto's.  TSH back to normal 04/23/2018.    Past Surgical History:  Procedure Laterality Date   CHOLECYSTECTOMY N/A 11/06/2019   Procedure: LAPAROSCOPIC CHOLECYSTECTOMY WITH INDOCYANINE GREEN DYE;  Surgeon: Ebbie Cough, MD;  [facility];  Service: General;  Laterality: N/A;   TONSILLECTOMY     child    Outpatient Medications  Prior to Visit  Medication Sig Dispense Refill   norethindrone -ethinyl estradiol  1/35 (ORTHO-NOVUM ) tablet Take 1 tablet by mouth at the same time every day. Take continuously skipping placebos as directed. 84 tablet 0   omeprazole  (PRILOSEC) 40 MG capsule Take 1 capsule (40 mg total) by mouth in the morning and at bedtime. OFFICE VISIT NEEDED FOR FURTHER REFILLS 60 capsule 1   ondansetron  (ZOFRAN -ODT) 4 MG disintegrating tablet Dissolve 0.5-1 tablets (2-4 mg total) by mouth 3 (three) times daily. 60 tablet 1   Phentermine -Topiramate  (QSYMIA ) 7.5-46 MG CP24 Take 1 capsule by mouth daily. 30 capsule 0   sertraline  (ZOLOFT ) 100 MG tablet Take 1 tablet (100 mg total) by mouth daily. 90 tablet 1   temazepam  (RESTORIL ) 15 MG capsule Take 1 capsule (15 mg total) by mouth at bedtime as needed for sleep. 30 capsule 5   Phentermine -Topiramate  (QSYMIA ) 3.75-23 MG CP24 Take 1 capsule by mouth daily for 15 days, THEN 2 capsules daily for 15 days. 45 capsule 0   No facility-administered medications prior to visit.    No Known Allergies  Review of Systems As per HPI  PE:    04/13/2023    1:07 PM 12/05/2022    9:51 AM 05/30/2022   10:39 AM  Vitals with BMI  Height   5' 3.75  Weight 198 lbs 166 lbs 10 oz 148 lbs 13 oz  BMI   25.75  Systolic 133 129 885  Diastolic 83 71 77  Pulse 80 77 90   Body mass  index is 34.25 kg/m.   Physical Exam  Wt Readings from Last 2 Encounters:  04/13/23 198 lb (89.8 kg)  12/05/22 166 lb 9.6 oz (75.6 kg)    Gen: alert, oriented x 4, affect pleasant.  Lucid thinking and conversation noted. HEENT: PERRLA, EOMI.   Neck: no LAD, mass, or thyromegaly. CV: RRR, no m/r/g LUNGS: CTA bilat, nonlabored. NEURO: no tremor or tics noted on observation.  Coordination intact. CN 2-12 grossly intact bilaterally, strength 5/5 in all extremeties.  No ataxia.   LABS:  Last metabolic panel Lab Results  Component Value Date   GLUCOSE 79 04/13/2023   NA 138 04/13/2023   K  4.4 04/13/2023   CL 102 04/13/2023   CO2 28 04/13/2023   BUN 16 04/13/2023   CREATININE 0.84 04/13/2023   GFR 94.11 04/13/2023   CALCIUM  9.3 04/13/2023   PROT 7.6 04/13/2023   ALBUMIN 4.2 04/13/2023   BILITOT 0.5 04/13/2023   ALKPHOS 46 04/13/2023   AST 16 04/13/2023   ALT 13 04/13/2023   ANIONGAP 9 11/04/2019   Last lipids Lab Results  Component Value Date   CHOL 319 (H) 04/13/2023   HDL 73.90 04/13/2023   LDLCALC 217 (H) 04/13/2023   LDLDIRECT 117.0 08/02/2020   TRIG 144.0 04/13/2023   CHOLHDL 4 04/13/2023   Last hemoglobin A1c Lab Results  Component Value Date   HGBA1C 5.2 05/30/2022   IMPRESSION AND PLAN:  1 obesity, BMI 34. Responded great to Ajovy but insurance denied further coverage. Trial of Qsymia  unsuccessful. She will continue to work to the [employer] and exercise. Point-of-care A1c today was 5.2%. 2.  Adult ADD. Start trial of Adderall XR 10 mg every morning. Therapeutic expectations and side effect profile of medication discussed today.  Patient's questions answered.  #3 insomnia, doing well on nightly use of temazepam  15 mg.  An After Visit Summary was printed and given to the patient.  FOLLOW UP: 3 to 4-week follow-up ADD  Signed:  [name], MD           05/10/2023

## 2023-04-13 ENCOUNTER — Ambulatory Visit: Payer: BC Managed Care – PPO | Admitting: Family Medicine

## 2023-04-13 ENCOUNTER — Encounter: Payer: Self-pay | Admitting: Family Medicine

## 2023-04-13 VITALS — BP 133/83 | HR 80 | Wt 198.0 lb

## 2023-04-13 DIAGNOSIS — R7989 Other specified abnormal findings of blood chemistry: Secondary | ICD-10-CM

## 2023-04-13 DIAGNOSIS — Z7689 Persons encountering health services in other specified circumstances: Secondary | ICD-10-CM | POA: Diagnosis not present

## 2023-04-13 DIAGNOSIS — H538 Other visual disturbances: Secondary | ICD-10-CM | POA: Diagnosis not present

## 2023-04-13 DIAGNOSIS — R632 Polyphagia: Secondary | ICD-10-CM

## 2023-04-13 DIAGNOSIS — Z79899 Other long term (current) drug therapy: Secondary | ICD-10-CM

## 2023-04-13 DIAGNOSIS — R631 Polydipsia: Secondary | ICD-10-CM | POA: Diagnosis not present

## 2023-04-13 DIAGNOSIS — R635 Abnormal weight gain: Secondary | ICD-10-CM | POA: Diagnosis not present

## 2023-04-13 DIAGNOSIS — F988 Other specified behavioral and emotional disorders with onset usually occurring in childhood and adolescence: Secondary | ICD-10-CM | POA: Diagnosis not present

## 2023-04-13 DIAGNOSIS — F5101 Primary insomnia: Secondary | ICD-10-CM | POA: Diagnosis not present

## 2023-04-13 LAB — LIPID PANEL
Cholesterol: 319 mg/dL — ABNORMAL HIGH (ref 0–200)
HDL: 73.9 mg/dL (ref >39.00)
LDL Cholesterol: 217 mg/dL — ABNORMAL HIGH (ref 0–99)
NonHDL: 245.5
Total CHOL/HDL Ratio: 4
Triglycerides: 144 mg/dL (ref 0.0–149.0)
VLDL: 28.8 mg/dL (ref 0.0–40.0)

## 2023-04-13 LAB — COMPREHENSIVE METABOLIC PANEL
ALT: 13 U/L (ref 0–35)
AST: 16 U/L (ref 0–37)
Albumin: 4.2 g/dL (ref 3.5–5.2)
Alkaline Phosphatase: 46 U/L (ref 39–117)
BUN: 16 mg/dL (ref 6–23)
CO2: 28 meq/L (ref 19–32)
Calcium: 9.3 mg/dL (ref 8.4–10.5)
Chloride: 102 meq/L (ref 96–112)
Creatinine, Ser: 0.84 mg/dL (ref 0.40–1.20)
GFR: 94.11 mL/min (ref >60.00)
Glucose, Bld: 79 mg/dL (ref 70–99)
Potassium: 4.4 meq/L (ref 3.5–5.1)
Sodium: 138 meq/L (ref 135–145)
Total Bilirubin: 0.5 mg/dL (ref 0.2–1.2)
Total Protein: 7.6 g/dL (ref 6.0–8.3)

## 2023-04-13 LAB — TSH: TSH: 9.96 u[IU]/mL — ABNORMAL HIGH (ref 0.35–5.50)

## 2023-04-13 MED ORDER — TEMAZEPAM 15 MG PO CAPS: 15.0000 mg | ORAL_CAPSULE | Freq: Every evening | ORAL | 5 refills | Status: DC | PRN

## 2023-04-13 MED ORDER — OMEPRAZOLE 40 MG PO CPDR: 40.0000 mg | DELAYED_RELEASE_CAPSULE | Freq: Two times a day (BID) | ORAL | 1 refills | Status: DC

## 2023-04-13 MED ORDER — ONDANSETRON 4 MG PO TBDP: 2.0000 mg | ORAL_TABLET | Freq: Three times a day (TID) | ORAL | 1 refills | Status: DC

## 2023-04-13 MED ORDER — SERTRALINE HCL 100 MG PO TABS: 100.0000 mg | ORAL_TABLET | Freq: Every day | ORAL | 3 refills | Status: DC

## 2023-04-16 ENCOUNTER — Ambulatory Visit (INDEPENDENT_AMBULATORY_CARE_PROVIDER_SITE_OTHER): Payer: 59

## 2023-04-16 DIAGNOSIS — R7989 Other specified abnormal findings of blood chemistry: Secondary | ICD-10-CM

## 2023-04-16 LAB — DRUG MONITORING PANEL 376104, URINE
Alphahydroxyalprazolam: NEGATIVE ng/mL (ref <25)
Alphahydroxymidazolam: NEGATIVE ng/mL (ref <50)
Alphahydroxytriazolam: NEGATIVE ng/mL (ref <50)
Aminoclonazepam: NEGATIVE ng/mL (ref <25)
Amphetamines: NEGATIVE ng/mL (ref <500)
Barbiturates: NEGATIVE ng/mL (ref <300)
Benzodiazepines: POSITIVE ng/mL — AB (ref <100)
Cocaine Metabolite: NEGATIVE ng/mL (ref <150)
Desmethyltramadol: NEGATIVE ng/mL (ref <100)
Hydroxyethylflurazepam: NEGATIVE ng/mL (ref <50)
Lorazepam: NEGATIVE ng/mL (ref <50)
Nordiazepam: NEGATIVE ng/mL (ref <50)
Opiates: NEGATIVE ng/mL (ref <100)
Oxazepam: 234 ng/mL — ABNORMAL HIGH (ref <50)
Oxycodone: NEGATIVE ng/mL (ref <100)
Temazepam: >8000 ng/mL — ABNORMAL HIGH (ref <50)
Tramadol: NEGATIVE ng/mL (ref <100)

## 2023-04-16 LAB — DM TEMPLATE

## 2023-04-16 LAB — T4, FREE: Free T4: 0.65 ng/dL (ref 0.60–1.60)

## 2023-04-17 ENCOUNTER — Encounter: Payer: Self-pay | Admitting: Family Medicine

## 2023-04-17 LAB — T3: T3, Total: 115 ng/dL (ref 76–181)

## 2023-04-17 MED ORDER — AMPHETAMINE-DEXTROAMPHET ER 10 MG PO CP24: 10.0000 mg | ORAL_CAPSULE | Freq: Every day | ORAL | 0 refills | Status: DC

## 2023-04-17 NOTE — Telephone Encounter (Signed)
 OK, adderall xr 10mg  prescription sent.   In person OR virtual f/u with me in 2-3 weeks.

## 2023-04-18 MED ORDER — ATORVASTATIN CALCIUM 40 MG PO TABS: 40.0000 mg | ORAL_TABLET | Freq: Every day | ORAL | 3 refills | Status: DC

## 2023-04-18 NOTE — Telephone Encounter (Signed)
 OK, sounds good. New atorastatin 40mg  prescription sent.

## 2023-04-25 ENCOUNTER — Encounter: Payer: Self-pay | Admitting: Family Medicine

## 2023-04-26 NOTE — Telephone Encounter (Signed)
No problem. Med pended-> please verify her pharmacy and go ahead and send it there.

## 2023-04-27 MED ORDER — NORETHINDRONE-ETH ESTRADIOL 1-35 MG-MCG PO TABS: ORAL_TABLET | ORAL | 3 refills | Status: DC

## 2023-05-10 ENCOUNTER — Encounter: Payer: Self-pay | Admitting: Family Medicine

## 2023-05-10 ENCOUNTER — Telehealth: Payer: BC Managed Care – PPO | Admitting: Family Medicine

## 2023-05-10 DIAGNOSIS — F988 Other specified behavioral and emotional disorders with onset usually occurring in childhood and adolescence: Secondary | ICD-10-CM

## 2023-05-10 LAB — POCT GLYCOSYLATED HEMOGLOBIN (HGB A1C)
HbA1c POC (<> result, manual entry): 5.2 % (ref 4.0–5.6)
HbA1c, POC (controlled diabetic range): 5.2 % (ref 0.0–7.0)
HbA1c, POC (prediabetic range): 5.2 % — AB (ref 5.7–6.4)
Hemoglobin A1C: 5.2 % (ref 4.0–5.6)

## 2023-05-10 MED ORDER — AMPHETAMINE-DEXTROAMPHET ER 10 MG PO CP24: 10.0000 mg | ORAL_CAPSULE | Freq: Every day | ORAL | 0 refills | Status: DC

## 2023-05-10 NOTE — Progress Notes (Signed)
 Virtual Visit via Video Note  I connected with Brittany Cooper  on 05/10/23 at  2:40 PM EST by a video enabled telemedicine application and verified that I am speaking with the correct person using two identifiers.  Location patient:  Location provider:work or home office Persons participating in the virtual visit: patient, provider  I discussed the limitations and requested verbal permission for telemedicine visit. The patient expressed understanding and agreed to proceed.  HPI: 28 year old female presents for follow-up adult ADD. I started her on Adderall XR 10 mg daily about 3 weeks ago.  She is happy to say that she has noted tremendous improvement. Pt states all is going well with the med at current dosing (adderall xr 10mg  qAM): much improved focus, concentration, task completion.  Less frustration, better multitasking, less impulsivity and restlessness.  Mood is stable. No side effects from the medication except some decreased appetite which she is managing fine.  She has significantly increased her evening exercise regimen.  She has noted increase in difficulty initiating sleep since doing this.  She maintains sleep fine. She had been sleeping well on temazepam  15 mg nightly until increasing this nighttime exercise. She takes 100 mg Benadryl in addition to her temazepam  now.  She takes the Adderall at around 5 AM.    PMP AWARE reviewed today: most recent rx for Adderall XR 10 mg was filled 04/17/2023, # 30, rx by me. No red flags.  ROS: See pertinent positives and negatives per HPI.  Past Medical History:  Diagnosis Date   Frequent headaches    GAD (generalized anxiety disorder)    GERD (gastroesophageal reflux disease)    Gout 11/2016   History of cholecystitis 11/2019   with gallstones; cholecystectomy 11/06/19   Hyperlipemia, mixed    recommended statin 09/2019   Hypersomnolence 2019   Noct polysomn + multiple sleep latency testing planned as of Nov 2019 neuro eval.    Migraines    Obesity, Class II, BMI 35-39.9    PMDD (premenstrual dysphoric disorder) 11/2016   Postcholecystectomy syndrome    Prediabetes    Subclinical hypothyroidism 02/2018   + TPO ab mildly elevated-->evolving hashimoto's.  TSH back to normal 04/23/2018.    Past Surgical History:  Procedure Laterality Date   CHOLECYSTECTOMY N/A 11/06/2019   Procedure: LAPAROSCOPIC CHOLECYSTECTOMY WITH INDOCYANINE GREEN DYE;  Surgeon: Ebbie Cough, MD;  Location: [facility];  Service: General;  Laterality: N/A;   TONSILLECTOMY     child     Current Outpatient Medications:    amphetamine -dextroamphetamine (ADDERALL XR) 10 MG 24 hr capsule, Take 1 capsule (10 mg total) by mouth daily., Disp: 30 capsule, Rfl: 0   atorvastatin  (LIPITOR) 40 MG tablet, Take 1 tablet (40 mg total) by mouth daily., Disp: 90 tablet, Rfl: 3   norethindrone -ethinyl estradiol  1/35 (ORTHO-NOVUM ) tablet, Take 1 tablet by mouth at the same time every day. Take continuously skipping placebos as directed., Disp: 84 tablet, Rfl: 3   omeprazole  (PRILOSEC) 40 MG capsule, Take 1 capsule (40 mg total) by mouth in the morning and at bedtime., Disp: 180 capsule, Rfl: 1   ondansetron  (ZOFRAN -ODT) 4 MG disintegrating tablet, Dissolve 0.5-1 tablets (2-4 mg total) by mouth 3 (three) times daily., Disp: 60 tablet, Rfl: 1   sertraline  (ZOLOFT ) 100 MG tablet, Take 1 tablet (100 mg total) by mouth daily., Disp: 90 tablet, Rfl: 3   temazepam  (RESTORIL ) 15 MG capsule, Take 1 capsule (15 mg total) by mouth at bedtime as needed for sleep., Disp: 30  capsule, Rfl: 5  EXAM:  VITALS per patient if applicable:     04/13/2023    1:07 PM 12/05/2022    9:51 AM 05/30/2022   10:39 AM  Vitals with BMI  Height   5' 3.75  Weight 198 lbs 166 lbs 10 oz 148 lbs 13 oz  BMI   25.75  Systolic 133 129 885  Diastolic 83 71 77  Pulse 80 77 90     GENERAL: alert, oriented, appears well and in no acute distress  HEENT: atraumatic, conjunttiva  clear, no obvious abnormalities on inspection of external nose and ears  NECK: normal movements of the head and neck  LUNGS: on inspection no signs of respiratory distress, breathing rate appears normal, no obvious gross SOB, gasping or wheezing  CV: no obvious cyanosis  MS: moves all visible extremities without noticeable abnormality  PSYCH/NEURO: pleasant and cooperative, no obvious depression or anxiety, speech and thought processing grossly intact  LABS: none  Lab Results  Component Value Date   CHOL 319 (H) 04/13/2023   HDL 73.90 04/13/2023   LDLCALC 217 (H) 04/13/2023   LDLDIRECT 117.0 08/02/2020   TRIG 144.0 04/13/2023   CHOLHDL 4 04/13/2023   ASSESSMENT AND PLAN:  Discussed the following assessment and plan:  Adult ADD, doing great on Adderall XR 10 mg a day. She has chronic insomnia and I think this has been acutely exacerbated by her exercising at night lately. She will change her routine to morning exercise.  In the meantime, continue temazepam  15 mg nightly and she will try to wean down off of Benadryl.   I discussed the assessment and treatment plan with the patient. The patient was provided an opportunity to ask questions and all were answered. The patient agreed with the plan and demonstrated an understanding of the instructions.   F/u: 6 wks  Signed:  [name], MD           05/10/2023

## 2023-06-12 ENCOUNTER — Other Ambulatory Visit: Payer: Self-pay | Admitting: Family Medicine

## 2023-06-13 MED ORDER — AMPHETAMINE-DEXTROAMPHET ER 10 MG PO CP24: 10.0000 mg | ORAL_CAPSULE | Freq: Every day | ORAL | 0 refills | Status: DC

## 2023-07-02 ENCOUNTER — Ambulatory Visit: Admitting: Family Medicine

## 2023-07-13 ENCOUNTER — Other Ambulatory Visit: Payer: Self-pay | Admitting: Family Medicine

## 2023-07-16 MED ORDER — AMPHETAMINE-DEXTROAMPHET ER 10 MG PO CP24: 10.0000 mg | ORAL_CAPSULE | Freq: Every day | ORAL | 0 refills | Status: DC

## 2023-07-16 NOTE — Telephone Encounter (Signed)
 Requesting: adderall Contract: N/A for this med UDS: 04/13/23 Last Visit: 05/10/23 Next Visit: 07/19/23 Last Refill: 06/13/23 (30,0)  Please Advise. Med pending

## 2023-07-19 ENCOUNTER — Ambulatory Visit (INDEPENDENT_AMBULATORY_CARE_PROVIDER_SITE_OTHER): Admitting: Family Medicine

## 2023-07-19 ENCOUNTER — Encounter: Payer: Self-pay | Admitting: Family Medicine

## 2023-07-19 VITALS — BP 128/74 | HR 99 | Wt 193.6 lb

## 2023-07-19 DIAGNOSIS — F411 Generalized anxiety disorder: Secondary | ICD-10-CM | POA: Diagnosis not present

## 2023-07-19 DIAGNOSIS — F988 Other specified behavioral and emotional disorders with onset usually occurring in childhood and adolescence: Secondary | ICD-10-CM | POA: Diagnosis not present

## 2023-07-19 DIAGNOSIS — Z79899 Other long term (current) drug therapy: Secondary | ICD-10-CM | POA: Diagnosis not present

## 2023-07-19 DIAGNOSIS — F5101 Primary insomnia: Secondary | ICD-10-CM

## 2023-07-19 MED ORDER — AMPHETAMINE-DEXTROAMPHET ER 20 MG PO CP24: 20.0000 mg | ORAL_CAPSULE | Freq: Every day | ORAL | 0 refills | Status: DC

## 2023-07-19 MED ORDER — DASETTA 1/35 (28) 1-35 MG-MCG PO TABS: 1.0000 | ORAL_TABLET | Freq: Every day | ORAL | 11 refills | Status: DC

## 2023-07-19 MED ORDER — SERTRALINE HCL 100 MG PO TABS: ORAL_TABLET | ORAL | 3 refills | Status: AC

## 2023-07-19 NOTE — Progress Notes (Signed)
 OFFICE VISIT  07/19/2023  CC:  Chief Complaint  Patient presents with   Medical Management of Chronic Issues    Pt does not feel like the medication is working as well as when she first starting taking it.    Patient is a 28 y.o. female who presents for 19-month follow-up adult ADD. A/P as of last visit: "Adult ADD, doing great on Adderall XR 10 mg a day. She has chronic insomnia and I think this has been acutely exacerbated by her exercising at night lately. She will change her routine to morning exercise.  In the meantime, continue temazepam  15 mg nightly and she will try to wean down off of Benadryl."  INTERIM HX: Still struggling quite a bit with chronic worry, difficulty getting sleep.  Says Adderall has not really affected this.  Says she has trouble feeling like Adderall does any help for her concentration or focus lately.  No depressed mood.  She has to get up early and work all day.  Then has long hours of studying for school after that.  Takes 100 mg of Benadryl along with her 15 mg of temazepam  every night and still has difficulty sleeping through the night very well.  No hypomanic behavior.  Review of systems: No headaches, no tremors, no palpitations, no urinary difficulties, no abdominal pain, no nausea.  Past Medical History:  Diagnosis Date   Frequent headaches    GAD (generalized anxiety disorder)    GERD (gastroesophageal reflux disease)    Gout 11/2016   History of cholecystitis 11/2019   with gallstones; cholecystectomy 11/06/19   Hyperlipemia, mixed    recommended statin 09/2019   Hypersomnolence 2019   Noct polysomn + multiple sleep latency testing planned as of Nov 2019 neuro eval.   Migraines    Obesity, Class II, BMI 35-39.9    PMDD (premenstrual dysphoric disorder) 11/2016   Postcholecystectomy syndrome    Prediabetes    Subclinical hypothyroidism 02/2018   + TPO ab mildly elevated-->evolving hashimoto's.  TSH back to normal 04/23/2018.    Past  Surgical History:  Procedure Laterality Date   CHOLECYSTECTOMY N/A 11/06/2019   Procedure: LAPAROSCOPIC CHOLECYSTECTOMY WITH INDOCYANINE GREEN DYE;  Surgeon: Enid Harry, MD;  Location: [facility];  Service: General;  Laterality: N/A;   TONSILLECTOMY     child    Outpatient Medications Prior to Visit  Medication Sig Dispense Refill   amphetamine -dextroamphetamine (ADDERALL XR) 10 MG 24 hr capsule Take 1 capsule (10 mg total) by mouth daily. 30 capsule 0   atorvastatin  (LIPITOR) 40 MG tablet Take 1 tablet (40 mg total) by mouth daily. 90 tablet 3   norethindrone -ethinyl estradiol  1/35 (ORTHO-NOVUM ) tablet Take 1 tablet by mouth at the same time every day. Take continuously skipping placebos as directed. 84 tablet 3   ondansetron  (ZOFRAN -ODT) 4 MG disintegrating tablet Dissolve 0.5-1 tablets (2-4 mg total) by mouth 3 (three) times daily. 60 tablet 1   sertraline  (ZOLOFT ) 100 MG tablet Take 1 tablet (100 mg total) by mouth daily. 90 tablet 3   temazepam  (RESTORIL ) 15 MG capsule Take 1 capsule (15 mg total) by mouth at bedtime as needed for sleep. 30 capsule 5   omeprazole  (PRILOSEC) 40 MG capsule Take 1 capsule (40 mg total) by mouth in the morning and at bedtime. 180 capsule 1   No facility-administered medications prior to visit.    No Known Allergies  Review of Systems As per HPI  PE:    07/19/2023  2:49 PM 04/13/2023    1:07 PM 12/05/2022    9:51 AM  Vitals with BMI  Weight 193 lbs 10 oz 198 lbs 166 lbs 10 oz  Systolic 128 133 161  Diastolic 74 83 71  Pulse 99 80 77     Physical Exam  Gen: Alert, well appearing.  Patient is oriented to person, place, time, and situation. AFFECT: pleasant, lucid thought and speech. No further exam today.  LABS:  none  IMPRESSION AND PLAN:  #1 GAD, not very well-controlled of late.  Increase sertraline  to 200 mg at night. Hopefully this will help with her sleep as well.  2.  Adult ADD.  Minimal effect from  Adderall XR 10 mg a day.  Will increase to 20 mg daily dose.  An After Visit Summary was printed and given to the patient.  FOLLOW UP: No follow-ups on file.  Signed:  Arletha Lady, MD           07/19/2023

## 2023-07-23 MED ORDER — DASETTA 1/35 (28) 1-35 MG-MCG PO TABS: ORAL_TABLET | ORAL | 11 refills | Status: DC

## 2023-07-23 NOTE — Telephone Encounter (Signed)
 Okay, prescription sent

## 2023-07-31 ENCOUNTER — Encounter: Payer: Self-pay | Admitting: Family Medicine

## 2023-08-01 ENCOUNTER — Other Ambulatory Visit (HOSPITAL_COMMUNITY): Payer: Self-pay

## 2023-08-01 ENCOUNTER — Other Ambulatory Visit: Payer: Self-pay | Admitting: Family Medicine

## 2023-08-01 ENCOUNTER — Telehealth: Payer: Self-pay

## 2023-08-01 MED ORDER — WEGOVY 2.4 MG/0.75ML ~~LOC~~ SOAJ: 2.4000 mg | SUBCUTANEOUS | 5 refills | Status: AC

## 2023-08-01 NOTE — Telephone Encounter (Signed)
 Pharmacy Patient Advocate Encounter   Received notification from RX Request Messages that prior authorization for Wegovy  2.4MG /0.75ML auto-injectors is required/requested.   Insurance verification completed.   The patient is insured through [employer] .   Per test claim: PA required; PA submitted to above mentioned insurance via CoverMyMeds Key/confirmation #/EOC [id number] Status is pending

## 2023-08-01 NOTE — Telephone Encounter (Signed)
 Please assist with PA.

## 2023-08-01 NOTE — Telephone Encounter (Signed)
 Okay, I just sent in a new prescription for Wegovy .  I sent the 2.4 mg dose but we will see if the insurance requires her to start over at the beginning dose.  Please take a look at the prior authorization attachment she sent. Thanks

## 2023-08-01 NOTE — Telephone Encounter (Signed)
 Forms are being reviewed.

## 2023-08-01 NOTE — Telephone Encounter (Signed)
 PA has been submitted and documented in separate encounter, please sign off on rx in this encounter as PA team is unable to resolve RX requests. Thank you

## 2023-08-07 NOTE — Telephone Encounter (Signed)
 Pharmacy Patient Advocate Encounter  Received notification from [facility] that Prior Authorization for Wegovy  2.4MG /0.75ML auto-injectors  has been DENIED.  Full denial letter will be uploaded to the media tab. See denial reason below.   DENIED DUE TO SEE BELOW

## 2023-08-07 NOTE — Telephone Encounter (Signed)
 FYI  Please see below

## 2023-08-15 ENCOUNTER — Other Ambulatory Visit: Payer: Self-pay | Admitting: Family Medicine

## 2023-08-15 MED ORDER — AMPHETAMINE-DEXTROAMPHET ER 20 MG PO CP24: 20.0000 mg | ORAL_CAPSULE | Freq: Every day | ORAL | 0 refills | Status: DC

## 2023-09-07 ENCOUNTER — Other Ambulatory Visit: Payer: Self-pay | Admitting: Family Medicine

## 2023-09-17 ENCOUNTER — Encounter: Payer: Self-pay | Admitting: Family Medicine

## 2023-09-18 MED ORDER — AMPHETAMINE-DEXTROAMPHET ER 20 MG PO CP24: 20.0000 mg | ORAL_CAPSULE | Freq: Every day | ORAL | 0 refills | Status: DC

## 2023-09-18 NOTE — Telephone Encounter (Signed)
 Okay, prescription sent

## 2023-10-04 ENCOUNTER — Other Ambulatory Visit: Payer: Self-pay | Admitting: Family Medicine

## 2023-10-26 ENCOUNTER — Encounter: Payer: Self-pay | Admitting: Family Medicine

## 2023-10-29 ENCOUNTER — Other Ambulatory Visit: Payer: Self-pay

## 2023-10-29 MED ORDER — AMPHETAMINE-DEXTROAMPHET ER 20 MG PO CP24: 20.0000 mg | ORAL_CAPSULE | Freq: Every day | ORAL | 0 refills | Status: DC

## 2023-10-29 NOTE — Telephone Encounter (Signed)
Prescription sent. Needs follow up.

## 2023-10-29 NOTE — Addendum Note (Signed)
 Addended by: CANDISE ALEENE DEL on: 10/29/2023 09:36 AM   Modules accepted: Orders

## 2023-10-29 NOTE — Telephone Encounter (Signed)
I sent this rx earlier today.

## 2023-10-29 NOTE — Telephone Encounter (Signed)
 Refill requested for Adderall last OV 07/19/23 next OV over due as of 08/16/23. Sent pt a message in [place] that she is overdue for OV.

## 2023-11-07 ENCOUNTER — Ambulatory Visit: Admitting: Family Medicine

## 2023-11-07 ENCOUNTER — Telehealth (INDEPENDENT_AMBULATORY_CARE_PROVIDER_SITE_OTHER): Admitting: Family Medicine

## 2023-11-07 DIAGNOSIS — F411 Generalized anxiety disorder: Secondary | ICD-10-CM | POA: Diagnosis not present

## 2023-11-07 DIAGNOSIS — F988 Other specified behavioral and emotional disorders with onset usually occurring in childhood and adolescence: Secondary | ICD-10-CM

## 2023-11-07 NOTE — Progress Notes (Signed)
 Virtual Visit via Video Note  I connected with Brittany Cooper  on 11/07/23 at  3:00 PM EDT by a video enabled telemedicine application and verified that I am speaking with the correct person using two identifiers.  Location patient: [place] Location provider:work or home office Persons participating in the virtual visit: patient, provider  I discussed the limitations and requested verbal permission for telemedicine visit. The patient expressed understanding and agreed to proceed.  CC: Follow-up ADD and anxiety  HPI: 28 year old female being seen today for 23-month follow-up ADD and anxiety. When I last saw her we increased her sertraline  to 200 mg nightly and increased her Adderall XR to 20 mg a day.   Brittany Cooper says everything is going well.  She has much improved focus, concentration, task completion.  Less frustration, better multitasking, less impulsivity and restlessness.  Mood is stable.  Anxiety level is stable. She does have excessive sweating since going up on her sertraline  and Adderall. She has learned to deal with this and it does not impact her quality of life  PMP AWARE reviewed today: most recent rx for Adderall XR 20 mg was filled 10/29/2023, # 30, rx by me.  Most recent temazepam  15 mg prescription was filled 10/08/2023, #30, prescription by me. No red flags.  ROS: See pertinent positives and negatives per HPI.  Past Medical History:  Diagnosis Date   Frequent headaches    GAD (generalized anxiety disorder)    GERD (gastroesophageal reflux disease)    Gout 11/2016   History of cholecystitis 11/2019   with gallstones; cholecystectomy 11/06/19   Hyperlipemia, mixed    recommended statin 09/2019   Hypersomnolence 2019   Noct polysomn + multiple sleep latency testing planned as of Nov 2019 neuro eval.   Migraines    Obesity, Class II, BMI 35-39.9    PMDD (premenstrual dysphoric disorder) 11/2016   Postcholecystectomy syndrome    Prediabetes    Subclinical hypothyroidism 02/2018    + TPO ab mildly elevated-->evolving hashimoto's.  TSH back to normal 04/23/2018.    Past Surgical History:  Procedure Laterality Date   CHOLECYSTECTOMY N/A 11/06/2019   Procedure: LAPAROSCOPIC CHOLECYSTECTOMY WITH INDOCYANINE GREEN DYE;  Surgeon: Ebbie Cough, MD;  Location: [facility];  Service: General;  Laterality: N/A;   TONSILLECTOMY     child     Current Outpatient Medications:    amphetamine -dextroamphetamine (ADDERALL XR) 20 MG 24 hr capsule, Take 1 capsule (20 mg total) by mouth daily., Disp: 30 capsule, Rfl: 0   atorvastatin  (LIPITOR) 40 MG tablet, Take 1 tablet (40 mg total) by mouth daily., Disp: 90 tablet, Rfl: 3   norethindrone -ethinyl estradiol  1/35 (DASETTA  1/35, 28,) tablet, 1 tab daily and take continuously, skipping placebos, Disp: 21 tablet, Rfl: 11   omeprazole  (PRILOSEC) 40 MG capsule, Take 1 capsule (40 mg total) by mouth in the morning and at bedtime., Disp: 180 capsule, Rfl: 1   ondansetron  (ZOFRAN -ODT) 4 MG disintegrating tablet, DISSOLVE 0.5-1 TABLETS (2-4 MG TOTAL) BY MOUTH 3 (THREE) TIMES DAILY., Disp: 45 tablet, Rfl: 2   Semaglutide -Weight Management (WEGOVY ) 2.4 MG/0.75ML SOAJ, Inject 2.4 mg into the skin once a week., Disp: 3 mL, Rfl: 5   sertraline  (ZOLOFT ) 100 MG tablet, 2 tabs po qhs, Disp: 180 tablet, Rfl: 3   temazepam  (RESTORIL ) 15 MG capsule, TAKE 1 CAPSULE BY MOUTH AT BEDTIME AS NEEDED FOR SLEEP., Disp: 30 capsule, Rfl: 5  EXAM:  VITALS per patient if applicable:     07/19/2023    2:49  PM 04/13/2023    1:07 PM 12/05/2022    9:51 AM  Vitals with BMI  Weight 193 lbs 10 oz 198 lbs 166 lbs 10 oz  Systolic 128 133 870  Diastolic 74 83 71  Pulse 99 80 77    GENERAL: alert, oriented, appears well and in no acute distress  HEENT: atraumatic, conjunttiva clear, no obvious abnormalities on inspection of external nose and ears  NECK: normal movements of the head and neck  LUNGS: on inspection no signs of respiratory distress,  breathing rate appears normal, no obvious gross SOB, gasping or wheezing  CV: no obvious cyanosis  MS: moves all visible extremities without noticeable abnormality  PSYCH/NEURO: pleasant and cooperative, no obvious depression or anxiety, speech and thought processing grossly intact  LABS: none today    Chemistry      Component Value Date/Time   NA 138 04/13/2023 1327   K 4.4 04/13/2023 1327   CL 102 04/13/2023 1327   CO2 28 04/13/2023 1327   BUN 16 04/13/2023 1327   CREATININE 0.84 04/13/2023 1327      Component Value Date/Time   CALCIUM  9.3 04/13/2023 1327   ALKPHOS 46 04/13/2023 1327   AST 16 04/13/2023 1327   ALT 13 04/13/2023 1327   BILITOT 0.5 04/13/2023 1327     Lab Results  Component Value Date   HGBA1C 5.2 05/10/2023   HGBA1C 5.2 05/10/2023   HGBA1C 5.2 (A) 05/10/2023   HGBA1C 5.2 05/10/2023   ASSESSMENT AND PLAN:  Discussed the following assessment and plan:  #1 adult ADHD. She is doing well on Adderall XR 20 mg a day. A new prescription was not needed today.  2.  GAD. Doing well on sertraline  200 mg a day.  No changes today.   I discussed the assessment and treatment plan with the patient. The patient was provided an opportunity to ask questions and all were answered. The patient agreed with the plan and demonstrated an understanding of the instructions.   F/u: 6 months.  We will do fasting health panel and hemoglobin A1c at that time.  Signed:  [name], MD           11/07/2023

## 2023-11-10 ENCOUNTER — Other Ambulatory Visit: Payer: Self-pay | Admitting: Family Medicine

## 2023-11-29 ENCOUNTER — Encounter: Payer: Self-pay | Admitting: Family Medicine

## 2023-11-29 MED ORDER — AMPHETAMINE-DEXTROAMPHET ER 20 MG PO CP24: 20.0000 mg | ORAL_CAPSULE | Freq: Every day | ORAL | 0 refills | Status: DC

## 2023-11-29 NOTE — Telephone Encounter (Signed)
 Requesting: adderall  Contract: 11/24/21 UDS: 04/13/23 Last Visit: 07/19/23 Next Visit: advised to f/u 4 weeks Last Refill: 10/29/23 (30,0)  Please Advise. Rx pending

## 2024-01-21 ENCOUNTER — Other Ambulatory Visit: Payer: Self-pay | Admitting: Genetic Counselor

## 2024-01-21 DIAGNOSIS — Z006 Encounter for examination for normal comparison and control in clinical research program: Secondary | ICD-10-CM

## 2024-01-22 ENCOUNTER — Encounter: Payer: Self-pay | Admitting: Family Medicine

## 2024-01-23 MED ORDER — AMPHETAMINE-DEXTROAMPHET ER 20 MG PO CP24: 20.0000 mg | ORAL_CAPSULE | Freq: Every day | ORAL | 0 refills | Status: DC

## 2024-01-23 NOTE — Telephone Encounter (Signed)
 Rx sent.

## 2024-01-29 ENCOUNTER — Telehealth: Admitting: Physician Assistant

## 2024-01-29 DIAGNOSIS — I8002 Phlebitis and thrombophlebitis of superficial vessels of left lower extremity: Secondary | ICD-10-CM | POA: Diagnosis not present

## 2024-01-29 NOTE — Progress Notes (Signed)
 Virtual Visit Consent   Brittany Cooper, you are scheduled for a virtual visit with a  provider today. Just as with appointments in the office, your consent must be obtained to participate. Your consent will be active for this visit and any virtual visit you may have with one of our providers in the next 365 days. If you have a MyChart account, a copy of this consent can be sent to you electronically.  As this is a virtual visit, video technology does not allow for your provider to perform a traditional examination. This may limit your provider's ability to fully assess your condition. If your provider identifies any concerns that need to be evaluated in person or the need to arrange testing (such as labs, EKG, etc.), we will make arrangements to do so. Although advances in technology are sophisticated, we cannot ensure that it will always work on either your end or our end. If the connection with a video visit is poor, the visit may have to be switched to a telephone visit. With either a video or telephone visit, we are not always able to ensure that we have a secure connection.  By engaging in this virtual visit, you consent to the provision of healthcare and authorize for your insurance to be billed (if applicable) for the services provided during this visit. Depending on your insurance coverage, you may receive a charge related to this service.  I need to obtain your verbal consent now. Are you willing to proceed with your visit today? Brittany Cooper has provided verbal consent on 01/29/2024 for a virtual visit (video or telephone). Brittany CHRISTELLA Dickinson, PA-C  Date: 01/29/2024 7:09 PM   Virtual Visit via Video Note   I, Brittany Cooper, connected with  Brittany Cooper  ([id number], [birth year]) on 01/29/24 at  6:30 PM EDT by a video-enabled telemedicine application and verified that I am speaking with the correct person using two identifiers.  Location: Patient: Virtual Visit  Location Patient: Home Provider: Virtual Visit Location Provider: Home Office   I discussed the limitations of evaluation and management by telemedicine and the availability of in person appointments. The patient expressed understanding and agreed to proceed.    History of Present Illness: Brittany Cooper is a 28 y.o. who identifies as a female who was assigned female at birth, and is being seen today for a small vein on the right lower leg that is hard on the end and tender to touch. Noticed this evening she developed an itch over the area. She looked and noticed the small vein was more prominent and swollen. She felt of the vein and it had a hard lump on the distal end that was tender to touch. She is not having any overall leg swelling, pain, redness, or warmth. It is all localized over the small superficial anterior vein. 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    Problems: There are no active problems to display for this patient.   Allergies: No Known Allergies Medications:  Current Outpatient Medications:    amphetamine -dextroamphetamine (ADDERALL XR) 20 MG 24 hr capsule, Take 1 capsule (20 mg total) by mouth daily., Disp: 30 capsule, Rfl: 0   atorvastatin  (LIPITOR) 40 MG tablet, Take 1 tablet (40 mg total) by mouth daily., Disp: 90 tablet, Rfl: 3   norethindrone -ethinyl estradiol  1/35 (DASETTA  1/35, 28,) tablet, 1 tab daily and take continuously, skipping  placebos, Disp: 21 tablet, Rfl: 11   omeprazole  (PRILOSEC) 40 MG capsule, TAKE 1 CAPSULE (40 MG TOTAL) BY MOUTH IN THE MORNING AND AT BEDTIME., Disp: 180 capsule, Rfl: 0   ondansetron  (ZOFRAN -ODT) 4 MG disintegrating tablet, DISSOLVE 0.5-1 TABLETS (2-4 MG TOTAL) BY MOUTH 3 (THREE) TIMES DAILY., Disp: 45 tablet, Rfl: 2   Semaglutide -Weight Management (WEGOVY ) 2.4 MG/0.75ML SOAJ, Inject 2.4 mg into the skin once a week., Disp: 3 mL, Rfl: 5   sertraline  (ZOLOFT )  100 MG tablet, 2 tabs po qhs, Disp: 180 tablet, Rfl: 3   temazepam  (RESTORIL ) 15 MG capsule, TAKE 1 CAPSULE BY MOUTH AT BEDTIME AS NEEDED FOR SLEEP., Disp: 30 capsule, Rfl: 5  Observations/Objective: Patient is well-developed, well-nourished in no acute distress.  Resting comfortably at home.  Head is normocephalic, atraumatic.  No labored breathing.  Speech is clear and coherent with logical content.  Patient is alert and oriented at baseline.  There is a small, superficial vein on the anterior right lower leg that is slightly more prominent (patient reports it is improving with elevation and sitting) that has a hardened area (per patient) on the distal end that is also tender to touch. There is no redness or warmth.  Assessment and Plan: 1. Thrombophlebitis of superficial veins of left lower extremity (Primary)  - Suspect superficial thrombophlebitis - Advised warm compresses - Epsom salt soaks - Compression stockings while working - Change position and move around more frequently as allowed at work - Elevate legs when at rest - Seek in person evaluation immediately if swelling worsens in leg, develops redness, pain, and warmth  Follow Up Instructions: I discussed the assessment and treatment plan with the patient. The patient was provided an opportunity to ask questions and all were answered. The patient agreed with the plan and demonstrated an understanding of the instructions.  A copy of instructions were sent to the patient via MyChart unless otherwise noted below.    The patient was advised to call back or seek an in-person evaluation if the symptoms worsen or if the condition fails to improve as anticipated.    Brittany CHRISTELLA Dickinson, PA-C

## 2024-01-29 NOTE — Patient Instructions (Signed)
 Chiquita FORBES Rummer, thank you for joining [name], PA-C for today's virtual visit.  While this provider is not your primary care provider (PCP), if your PCP is located in our provider database this encounter information will be shared with them immediately following your visit.   A [facility] MyChart account gives you access to today's visit and all your visits, tests, and labs performed at [facility]  click here if you don't have a [facility] MyChart account or go to [email or web address]  Consent: (Patient) Brittany Cooper provided verbal consent for this virtual visit at the beginning of the encounter.  Current Medications:  Current Outpatient Medications:    amphetamine -dextroamphetamine (ADDERALL XR) 20 MG 24 hr capsule, Take 1 capsule (20 mg total) by mouth daily., Disp: 30 capsule, Rfl: 0   atorvastatin  (LIPITOR) 40 MG tablet, Take 1 tablet (40 mg total) by mouth daily., Disp: 90 tablet, Rfl: 3   norethindrone -ethinyl estradiol  1/35 (DASETTA  1/35, 28,) tablet, 1 tab daily and take continuously, skipping placebos, Disp: 21 tablet, Rfl: 11   omeprazole  (PRILOSEC) 40 MG capsule, TAKE 1 CAPSULE (40 MG TOTAL) BY MOUTH IN THE MORNING AND AT BEDTIME., Disp: 180 capsule, Rfl: 0   ondansetron  (ZOFRAN -ODT) 4 MG disintegrating tablet, DISSOLVE 0.5-1 TABLETS (2-4 MG TOTAL) BY MOUTH 3 (THREE) TIMES DAILY., Disp: 45 tablet, Rfl: 2   Semaglutide -Weight Management (WEGOVY ) 2.4 MG/0.75ML SOAJ, Inject 2.4 mg into the skin once a week., Disp: 3 mL, Rfl: 5   sertraline  (ZOLOFT ) 100 MG tablet, 2 tabs po qhs, Disp: 180 tablet, Rfl: 3   temazepam  (RESTORIL ) 15 MG capsule, TAKE 1 CAPSULE BY MOUTH AT BEDTIME AS NEEDED FOR SLEEP., Disp: 30 capsule, Rfl: 5   Medications ordered in this encounter:  No orders of the defined types were placed in this encounter.    *If you need refills on other medications prior to your next appointment, please contact your  pharmacy*  Follow-Up: Call back or seek an in-person evaluation if the symptoms worsen or if the condition fails to improve as anticipated.  [facility] [phone]  Other Instructions Thrombophlebitis  Thrombophlebitis is a condition in which a blood clot and inflammation occur inside a vein. This can happen in the arms, legs, or torso. Thrombophlebitis may involve superficial veins, deep veins, or both. Superficial veins are close to the surface of the body and are part of the superficial venous system. Veins that are deeper inside the body are part of the deep venous system. When this condition happens in a superficial vein (superficial thrombophlebitis), it is usually not serious.However, when the condition happens in a vein that is part of the deep venous system (deep vein thrombosis, DVT), it can cause serious problems. What are the causes? This condition may be caused by: Infection, injury, or trauma to a vein. Inflammation of the veins. Medical conditions that can cause blood to clot more easily (hypercoagulable state). Backing up, or reflux, of blood flow through the veins (chronic venous insufficiency or venous stasis). What increases the risk? The following factors may make you more likely to develop this condition: Having a long, thin tube (catheter) put in a vein, such as a central line, port, or IV catheter. Getting certain medicines through a catheter that can irritate the vein. Pregnancy or having recently given birth. Cancer. Obesity. Taking oral contraceptive pills (OCPs) or hormone therapy (HT) medicines. Spasms of veins. Immobilization, or not moving the limbs for prolonged periods. What are the signs or  symptoms? The main symptoms of this condition are: Swelling and pain in an arm or leg. If the affected vein is in the leg, you may feel pain while standing or walking. Warmth or redness in an arm or leg. Tenderness in the affected area when it is  touched. Other symptoms include: Low-grade fever. Muscle aches. A bulging or hard vein (venous distension). In some cases, there are no symptoms. How is this diagnosed? This condition may be diagnosed based on: Your symptoms and medical history. A physical exam. Tests, such as a test that uses sound waves to make images (duplex ultrasound). How is this treated? Treatment depends on how severe the condition is and which area of the body is affected. Treatment may include: Applying a warm compress or heating pad to affected areas. This may need to be repeated several times a day. Moving the affected limb. For example, you may need to start doing walking exercises right away. You will also be encouraged to continue your usual daily activities. Raising (elevating) the affected arm or leg above the level of your heart. Medicines, such as: Anti-inflammatory medicines, such as ibuprofen. Blood thinners (anticoagulants) or anti-platelet drugs such as aspirin. Removing an IV or central line that may be causing the problem. Wearing compression stockings to help prevent blood clots and reduce swelling in your legs. Follow these instructions at home: Medicines Take over-the-counter and prescription medicines only as told by your health care provider. If you are taking blood thinners: Talk with your health care provider before you take any medicines that contain aspirin or NSAIDs, such as ibuprofen. These medicines increase your risk for dangerous bleeding. Take your medicine exactly as told, at the same time every day. Avoid activities that could cause injury or bruising, and follow instructions about how to prevent falls. Wear a medical alert bracelet or carry a card that lists what medicines you take. Managing pain, stiffness, and swelling  If directed, apply heat to the affected area as often as told by your health care provider. Use the heat source that your health care provider recommends,  such as a moist heat pack or a heating pad. Place a towel between your skin and the heat source. Leave the heat on for 20-30 minutes. Remove the heat if your skin turns bright red. This is especially important if you are unable to feel pain, heat, or cold. You have a greater risk of getting burned. Elevate the affected area above the level of your heart while you are sitting or lying down. Activity Return to your normal activities as told by your health care provider. Ask your health care provider what activities are safe for you. Avoid sitting for a long time without moving. Get up to take short walks every 1-2 hours. This is important to improve blood flow and breathing. Ask for help if you feel weak or unsteady. Do exercises as told by your health care provider. Rest as told by your health care provider. General instructions Drink enough fluid to keep your urine pale yellow. Wear compression stockings as told by your health care provider. Do not use any products that contain nicotine or tobacco. These products include cigarettes, chewing tobacco, and vaping devices, such as e-cigarettes. If you need help quitting, ask your health care provider. Keep all follow-up visits. This is important. Contact a health care provider if: You miss a dose of your blood thinner, if applicable. Your symptoms do not improve. You have unusual bruising. You have nausea, vomiting, or  diarrhea that lasts for more than a day. Get help right away if: You are breathing fast or have chest pain. You have blood in your vomit, urine, or stool. You have severe pain in your affected arm or leg or new pain in any arm or leg. You have light-headedness, dizziness, a severe headache, or confusion. These symptoms may represent a serious problem that is an emergency. Do not wait to see if the symptoms will go away. Get medical help right away. Call your local emergency services (911 in the [place] Do not drive yourself to the  hospital. Summary Thrombophlebitis is a condition in which a blood clot forms in a vein, causing inflammation and often pain. This can happen in both superficial and deep veins. The main symptom of this condition is swelling and pain around the affected vein. Tenderness and redness may also be present. Treatment may include warm compresses, compression stockings, anti-inflammatory medicines, or blood thinners. Make sure you take all medicines, especially blood thinners, as instructed and keep all follow-up visits to ensure proper healing of the vein. This information is not intended to replace advice given to you by your health care provider. Make sure you discuss any questions you have with your health care provider. Document Revised: 09/13/2020 Document Reviewed: 09/13/2020 Elsevier Patient Education  2024 [employer]   If you have been instructed to have an in-person evaluation today at a local Urgent Care facility, please use the link below. It will take you to a list of all of our available [facility], including address, phone number and hours of operation. Please do not delay care.  [facility]  If you or a family member do not have a primary care provider, use the link below to schedule a visit and establish care. When you choose a [facility] primary care physician or advanced practice provider, you gain a long-term partner in health. Find a Primary Care Provider  Learn more about [facility] in-office and virtual care options: [facility] - Get Care Now

## 2024-02-05 ENCOUNTER — Encounter: Payer: Self-pay | Admitting: Family Medicine

## 2024-02-05 NOTE — Telephone Encounter (Signed)
 Please fill, if appropriate. Original rx sent to [facility] but they are out of stock. CMA will call and cancel rx once PCP responds

## 2024-02-06 MED ORDER — AMPHETAMINE-DEXTROAMPHET ER 20 MG PO CP24: 20.0000 mg | ORAL_CAPSULE | Freq: Every day | ORAL | 0 refills | Status: AC

## 2024-02-17 ENCOUNTER — Other Ambulatory Visit: Payer: Self-pay | Admitting: Family Medicine

## 2024-02-19 ENCOUNTER — Telehealth: Admitting: Physician Assistant

## 2024-02-19 DIAGNOSIS — J208 Acute bronchitis due to other specified organisms: Secondary | ICD-10-CM | POA: Diagnosis not present

## 2024-02-19 DIAGNOSIS — B9689 Other specified bacterial agents as the cause of diseases classified elsewhere: Secondary | ICD-10-CM

## 2024-02-19 MED ORDER — BENZONATATE 100 MG PO CAPS: 100.0000 mg | ORAL_CAPSULE | Freq: Three times a day (TID) | ORAL | 0 refills | Status: AC | PRN

## 2024-02-19 MED ORDER — AZITHROMYCIN 250 MG PO TABS: ORAL_TABLET | ORAL | 0 refills | Status: AC

## 2024-02-19 NOTE — Progress Notes (Signed)
 We are sorry that you are not feeling well.  Here is how we plan to help!  Based on your presentation I believe you most likely have A cough due to bacteria.  When patients have a fever and a productive cough with a change in color or increased sputum production, we are concerned about bacterial bronchitis.  If left untreated it can progress to pneumonia.  If your symptoms do not improve with your treatment plan it is important that you contact your provider.   I have prescribed Azithromyin 250 mg: two tablets now and then one tablet daily for 4 additonal days    In addition you may use A prescription cough medication called Tessalon  Perles 100mg . You may take 1-2 capsules every 8 hours as needed for your cough.  From your responses in the eVisit questionnaire you describe inflammation in the upper respiratory tract which is causing a significant cough.  This is commonly called Bronchitis and has four common causes:   Allergies Viral Infections Acid Reflux Bacterial Infection Allergies, viruses and acid reflux are treated by controlling symptoms or eliminating the cause. An example might be a cough caused by taking certain blood pressure medications. You stop the cough by changing the medication. Another example might be a cough caused by acid reflux. Controlling the reflux helps control the cough.  USE OF BRONCHODILATOR (RESCUE) INHALERS: There is a risk from using your bronchodilator too frequently.  The risk is that over-reliance on a medication which only relaxes the muscles surrounding the breathing tubes can reduce the effectiveness of medications prescribed to reduce swelling and congestion of the tubes themselves.  Although you feel brief relief from the bronchodilator inhaler, your asthma may actually be worsening with the tubes becoming more swollen and filled with mucus.  This can delay other crucial treatments, such as oral steroid medications. If you need to use a bronchodilator inhaler  daily, several times per day, you should discuss this with your provider.  There are probably better treatments that could be used to keep your asthma under control.     HOME CARE Only take medications as instructed by your medical team. Complete the entire course of an antibiotic. Drink plenty of fluids and get plenty of rest. Avoid close contacts especially the very young and the elderly Cover your mouth if you cough or cough into your sleeve. Always remember to wash your hands A steam or ultrasonic humidifier can help congestion.   GET HELP RIGHT AWAY IF: You develop worsening fever. You become short of breath You cough up blood. Your symptoms persist after you have completed your treatment plan MAKE SURE YOU  Understand these instructions. Will watch your condition. Will get help right away if you are not doing well or get worse.  Your e-visit answers were reviewed by a board certified advanced clinical practitioner to complete your personal care plan.  Depending on the condition, your plan could have included both over the counter or prescription medications. If there is a problem please reply  once you have received a response from your provider. Your safety is important to us .  If you have drug allergies check your prescription carefully.    You can use MyChart to ask questions about today's visit, request a non-urgent call back, or ask for a work or school excuse for 24 hours related to this e-Visit. If it has been greater than 24 hours you will need to follow up with your provider, or enter a new e-Visit to  address those concerns. You will get an e-mail in the next two days asking about your experience.  I hope that your e-visit has been valuable and will speed your recovery. Thank you for using e-visits.   I have spent 5 minutes in review of e-visit questionnaire, review and updating patient chart, medical decision making and response to patient.   Elsie Velma Lunger,  PA-C

## 2024-03-29 ENCOUNTER — Encounter: Payer: Self-pay | Admitting: Family Medicine

## 2024-03-30 ENCOUNTER — Other Ambulatory Visit: Payer: Self-pay | Admitting: Family Medicine

## 2024-03-31 ENCOUNTER — Other Ambulatory Visit: Payer: Self-pay

## 2024-03-31 MED ORDER — ATORVASTATIN CALCIUM 40 MG PO TABS: 40.0000 mg | ORAL_TABLET | Freq: Every day | ORAL | 0 refills | Status: AC

## 2024-03-31 MED ORDER — DASETTA 1/35 (28) 1-35 MG-MCG PO TABS: ORAL_TABLET | ORAL | 6 refills | Status: AC

## 2024-03-31 NOTE — Telephone Encounter (Signed)
"  No further action needed at this time.  "

## 2024-04-04 MED ORDER — TEMAZEPAM 15 MG PO CAPS: 15.0000 mg | ORAL_CAPSULE | Freq: Every evening | ORAL | 5 refills | Status: AC | PRN

## 2024-04-04 NOTE — Telephone Encounter (Signed)
 Ok, rx sent

## 2024-05-08 ENCOUNTER — Other Ambulatory Visit: Payer: Self-pay | Admitting: Family Medicine
